# Patient Record
Sex: Female | Born: 1950 | Race: White | Hispanic: No | Marital: Married | Smoking: Never smoker
Health system: Southern US, Community
[De-identification: ages and names within clinical notes are randomized; demographics above are authoritative.]

## PROBLEM LIST (undated history)

## (undated) DIAGNOSIS — E119 Type 2 diabetes mellitus without complications: Secondary | ICD-10-CM

## (undated) DIAGNOSIS — E079 Disorder of thyroid, unspecified: Secondary | ICD-10-CM

## (undated) DIAGNOSIS — I10 Essential (primary) hypertension: Secondary | ICD-10-CM

## (undated) DIAGNOSIS — E785 Hyperlipidemia, unspecified: Secondary | ICD-10-CM

## (undated) DIAGNOSIS — R000 Tachycardia, unspecified: Secondary | ICD-10-CM

## (undated) DIAGNOSIS — J45909 Unspecified asthma, uncomplicated: Secondary | ICD-10-CM

## (undated) HISTORY — PX: ABDOMINAL HYSTERECTOMY: SHX81

---

## 2013-02-13 ENCOUNTER — Emergency Department (HOSPITAL_COMMUNITY)
Admission: EM | Admit: 2013-02-13 | Discharge: 2013-02-13 | Disposition: A | Discharge status: Home / Self Care | Payer: Medicare HMO | Source: Home / Self Care

## 2013-02-13 ENCOUNTER — Encounter (HOSPITAL_COMMUNITY): Payer: Self-pay | Admitting: Emergency Medicine

## 2013-02-13 DIAGNOSIS — J329 Chronic sinusitis, unspecified: Secondary | ICD-10-CM

## 2013-02-13 HISTORY — DX: Unspecified asthma, uncomplicated: J45.909

## 2013-02-13 HISTORY — DX: Tachycardia, unspecified: R00.0

## 2013-02-13 HISTORY — DX: Disorder of thyroid, unspecified: E07.9

## 2013-02-13 LAB — POCT RAPID STREP A: Streptococcus, Group A Screen (Direct): NEGATIVE

## 2013-02-13 MED ORDER — AMOXICILLIN-POT CLAVULANATE 875-125 MG PO TABS: 1.0000 | ORAL_TABLET | Freq: Two times a day (BID) | ORAL | Status: DC

## 2013-02-13 NOTE — ED Notes (Signed)
Throat Culture sent to lab with manual request.  

## 2013-02-13 NOTE — ED Provider Notes (Signed)
History     CSN: [id number]  Arrival date & time 02/13/13  1426   First MD Initiated Contact with Patient 02/13/13 1543      Chief Complaint  Patient presents with  . URI    (Consider location/radiation/quality/duration/timing/severity/associated sxs/prior treatment) Patient is a 62 y.o. female presenting with URI. The history is provided by the patient.  URI Presenting symptoms: congestion, cough, facial pain, fatigue and sore throat   Presenting symptoms: no fever and no rhinorrhea   Severity:  Moderate Onset quality:  Sudden (has had allergy sx with clear rhinorrhea for a few months) Duration:  1 day Progression:  Unchanged Chronicity:  New Worsened by:  Nothing tried Associated symptoms: sinus pain   Associated symptoms: no arthralgias, no headaches, no myalgias, no neck pain, no sneezing, no swollen glands and no wheezing     Past Medical History  Diagnosis Date  . Asthma   . Thyroid disease   . Tachycardia     History reviewed. No pertinent past surgical history.  No family history on file.  History  Substance Use Topics  . Smoking status: Never Smoker   . Smokeless tobacco: Not on file  . Alcohol Use: Yes    OB History   Grav Para Term Preterm Abortions TAB SAB Ect Mult Living                  Review of Systems  Constitutional: Positive for fatigue. Negative for fever.  HENT: Positive for congestion and sore throat. Negative for rhinorrhea, sneezing and neck pain.   Respiratory: Positive for cough. Negative for wheezing.   Musculoskeletal: Negative for myalgias and arthralgias.  Neurological: Negative for headaches.  All other systems reviewed and are negative.    Allergies  Ciprofloxacin; Latex; and Neosporin  Home Medications   Current Outpatient Rx  Name  Route  Sig  Dispense  Refill  . esomeprazole (NEXIUM) 40 MG capsule   Oral   Take 40 mg by mouth daily before breakfast.         . metFORMIN (GLUCOPHAGE) 500 MG tablet   Oral  Take 500 mg by mouth 2 (two) times daily with a meal.         . metoprolol succinate (TOPROL-XL) 25 MG 24 hr tablet   Oral   Take 25 mg by mouth daily.         Marland Kitchen thyroid (ARMOUR) 90 MG tablet   Oral   Take 90 mg by mouth daily.           BP 142/85  Pulse 96  Temp(Src) 98.7 F (37.1 C) (Oral)  Resp 20  SpO2 97%  Physical Exam  Nursing note and vitals reviewed. Constitutional: She is oriented to person, place, and time. She appears well-developed and well-nourished.  HENT:  Head: Normocephalic and atraumatic.  Right Ear: External ear normal.  Left Ear: External ear normal.  Nose: Nose normal.  Mouth/Throat: Oropharynx is clear and moist. No oropharyngeal exudate.  Eyes: Conjunctivae and EOM are normal. Pupils are equal, round, and reactive to light. Right eye exhibits no discharge. Left eye exhibits no discharge. No scleral icterus.  Neck: Normal range of motion. Neck supple.  Cardiovascular: Normal rate, regular rhythm, normal heart sounds and intact distal pulses.   Pulmonary/Chest: Effort normal and breath sounds normal. No respiratory distress. She has no wheezes. She has no rales. She exhibits no tenderness.  Abdominal: Soft. Bowel sounds are normal. She exhibits no distension and no mass. There is  no tenderness. There is no rebound and no guarding.  Musculoskeletal: Normal range of motion. She exhibits no edema.  Lymphadenopathy:    She has no cervical adenopathy.  Neurological: She is alert and oriented to person, place, and time. She has normal reflexes. She displays normal reflexes. She exhibits abnormal muscle tone. Coordination normal.  Skin: Skin is warm and dry.  Psychiatric: She has a normal mood and affect.    ED Course  Procedures (including critical care time)  Labs Reviewed - No data to display No results found.   1. Sinusitis       MDM  Discussed dx and orders.  See instructions.  SE and precautions discussed        Maryelizabeth Rowan,  MD 02/13/13 1557

## 2013-02-13 NOTE — ED Notes (Signed)
Pt c/o cold sxs onset last night... sxs include: HA, ST, BA, mild cough Denies: f/v/n/d... She is alert and oriented w/no signs of acute distress.

## 2013-02-15 LAB — CULTURE, GROUP A STREP

## 2013-09-29 ENCOUNTER — Other Ambulatory Visit: Payer: Self-pay | Admitting: Family Medicine

## 2013-09-29 DIAGNOSIS — R51 Headache: Secondary | ICD-10-CM

## 2013-09-29 DIAGNOSIS — J329 Chronic sinusitis, unspecified: Secondary | ICD-10-CM

## 2013-09-29 DIAGNOSIS — R519 Headache, unspecified: Secondary | ICD-10-CM

## 2013-10-05 ENCOUNTER — Other Ambulatory Visit: Payer: Medicare HMO

## 2013-10-07 ENCOUNTER — Ambulatory Visit
Admission: RE | Admit: 2013-10-07 | Discharge: 2013-10-07 | Disposition: A | Discharge status: Home / Self Care | Payer: 59 | Source: Ambulatory Visit | Attending: Family Medicine | Admitting: Family Medicine

## 2013-10-07 DIAGNOSIS — J329 Chronic sinusitis, unspecified: Secondary | ICD-10-CM

## 2013-10-07 DIAGNOSIS — R519 Headache, unspecified: Secondary | ICD-10-CM

## 2013-10-07 DIAGNOSIS — R51 Headache: Secondary | ICD-10-CM

## 2014-04-11 ENCOUNTER — Other Ambulatory Visit: Payer: Self-pay | Admitting: Otolaryngology

## 2014-04-11 DIAGNOSIS — R0981 Nasal congestion: Secondary | ICD-10-CM

## 2014-04-11 DIAGNOSIS — R059 Cough, unspecified: Secondary | ICD-10-CM

## 2014-04-11 DIAGNOSIS — R05 Cough: Secondary | ICD-10-CM

## 2014-04-11 DIAGNOSIS — J0100 Acute maxillary sinusitis, unspecified: Secondary | ICD-10-CM

## 2014-04-19 ENCOUNTER — Encounter (INDEPENDENT_AMBULATORY_CARE_PROVIDER_SITE_OTHER): Payer: Self-pay

## 2014-04-19 ENCOUNTER — Ambulatory Visit
Admission: RE | Admit: 2014-04-19 | Discharge: 2014-04-19 | Disposition: A | Discharge status: Home / Self Care | Payer: 59 | Source: Ambulatory Visit | Attending: Otolaryngology | Admitting: Otolaryngology

## 2014-04-19 DIAGNOSIS — R059 Cough, unspecified: Secondary | ICD-10-CM

## 2014-04-19 DIAGNOSIS — R0981 Nasal congestion: Secondary | ICD-10-CM

## 2014-04-19 DIAGNOSIS — R05 Cough: Secondary | ICD-10-CM

## 2014-04-19 DIAGNOSIS — J0100 Acute maxillary sinusitis, unspecified: Secondary | ICD-10-CM

## 2014-07-18 ENCOUNTER — Ambulatory Visit (HOSPITAL_BASED_OUTPATIENT_CLINIC_OR_DEPARTMENT_OTHER): Payer: 59 | Attending: Otolaryngology | Admitting: Radiology

## 2014-07-18 VITALS — Ht 64.0 in | Wt 164.0 lb

## 2014-07-18 DIAGNOSIS — Z6828 Body mass index (BMI) 28.0-28.9, adult: Secondary | ICD-10-CM | POA: Diagnosis not present

## 2014-07-18 DIAGNOSIS — G4730 Sleep apnea, unspecified: Secondary | ICD-10-CM | POA: Diagnosis not present

## 2014-07-18 DIAGNOSIS — G4710 Hypersomnia, unspecified: Secondary | ICD-10-CM | POA: Insufficient documentation

## 2014-07-18 DIAGNOSIS — G4733 Obstructive sleep apnea (adult) (pediatric): Secondary | ICD-10-CM

## 2014-07-23 DIAGNOSIS — G4733 Obstructive sleep apnea (adult) (pediatric): Secondary | ICD-10-CM

## 2014-07-23 NOTE — Sleep Study (Signed)
NAME: Kayla Becker DATE OF BIRTH:  June 09, 1951 MEDICAL RECORD NUMBER [id number]  LOCATION: [facility]  PHYSICIAN: YOUNG,CLINTON D  DATE OF STUDY: 07/18/2014  SLEEP STUDY TYPE: Nocturnal Polysomnogram               REFERRING PHYSICIAN: Dillard Cannon E, *  INDICATION FOR STUDY: Hypersomnia with sleep apnea  EPWORTH SLEEPINESS SCORE:   16/24 HEIGHT: 5\' 4"  (162.6 cm)  WEIGHT: 164 lb (74.39 kg)    Body mass index is 28.14 kg/(m^2).  NECK SIZE: 13.5 in.  MEDICATIONS: Charted for review   SLEEP ARCHITECTURE: Total sleep time 344 minutes with sleep efficiency 87.5%. Stage I was 3.2%, stage II 69.8%, stage III 1.9%, REM 25.1% of total sleep time. Sleep latency 37.5 minutes, REM latency 70 minutes, awake after sleep onset 12.5 minutes, arousal index 12.9, bedtime medication: Crestor, losartan, Dexilant, nortriptyline, Omnaris, loratadine  RESPIRATORY DATA: Apnea hypopnea index (AHI) 1.7 per hour. 10 total events scored including 2 central apneas and 8 hypopneas. Non-positional events. REM AHI 5.5 per hour. There were not enough events to permit CPAP titration.  OXYGEN DATA: Moderate snoring with oxygen desaturation to a nadir of 90% and mean saturation 93.5% on room air.  CARDIAC DATA: Normal sinus rhythm  MOVEMENT/PARASOMNIA: No significant movement disturbance, no bathroom trips  IMPRESSION/ RECOMMENDATION:   1) Unremarkable sleep pattern 2) Respiratory pattern within normal limits. AHI 1.7 per hour- the normal range for adults is an AHI from 0-5 events per hour). REM AHI 5.5 per hour. Non-positional events. Moderate snoring with oxygen desaturation to a nadir of 90% and mean saturation 93.5% on room air.   Waymon Budge Diplomate, American Board of Sleep Medicine  ELECTRONICALLY SIGNED ON:  07/23/2014, 9:31 AM [facility] PH: [phone]   FX: [phone] ACCREDITED BY THE AMERICAN ACADEMY OF SLEEP MEDICINE

## 2014-08-03 ENCOUNTER — Other Ambulatory Visit: Payer: Self-pay

## 2014-08-03 DIAGNOSIS — Z1231 Encounter for screening mammogram for malignant neoplasm of breast: Secondary | ICD-10-CM

## 2014-08-24 ENCOUNTER — Ambulatory Visit
Admission: RE | Admit: 2014-08-24 | Discharge: 2014-08-24 | Disposition: A | Discharge status: Home / Self Care | Payer: Private Health Insurance - Indemnity | Source: Ambulatory Visit

## 2014-08-24 DIAGNOSIS — Z1231 Encounter for screening mammogram for malignant neoplasm of breast: Secondary | ICD-10-CM

## 2015-07-25 ENCOUNTER — Other Ambulatory Visit: Payer: Self-pay

## 2015-07-25 DIAGNOSIS — Z1231 Encounter for screening mammogram for malignant neoplasm of breast: Secondary | ICD-10-CM

## 2015-08-30 ENCOUNTER — Ambulatory Visit
Admission: RE | Admit: 2015-08-30 | Discharge: 2015-08-30 | Disposition: A | Discharge status: Home / Self Care | Payer: BLUE CROSS/BLUE SHIELD | Source: Ambulatory Visit

## 2015-08-30 DIAGNOSIS — Z1231 Encounter for screening mammogram for malignant neoplasm of breast: Secondary | ICD-10-CM

## 2016-05-11 ENCOUNTER — Ambulatory Visit (HOSPITAL_COMMUNITY)
Admission: EM | Admit: 2016-05-11 | Discharge: 2016-05-11 | Disposition: A | Discharge status: Home / Self Care | Payer: Managed Care, Other (non HMO) | Attending: Internal Medicine | Admitting: Internal Medicine

## 2016-05-11 ENCOUNTER — Encounter (HOSPITAL_COMMUNITY): Payer: Self-pay | Admitting: Emergency Medicine

## 2016-05-11 DIAGNOSIS — H15102 Unspecified episcleritis, left eye: Secondary | ICD-10-CM

## 2016-05-11 MED ORDER — DICLOFENAC SODIUM 0.1 % OP SOLN: 1.0000 [drp] | Freq: Four times a day (QID) | OPHTHALMIC | 0 refills | Status: AC

## 2016-05-11 MED ORDER — CARBOXYMETHYLCELLULOSE SODIUM 0.5 % OP SOLN: 2.0000 [drp] | Freq: Four times a day (QID) | OPHTHALMIC | 0 refills | Status: AC | PRN

## 2016-05-11 NOTE — ED Triage Notes (Signed)
Left eye issue

## 2016-05-11 NOTE — ED Notes (Signed)
Complains of headache, eye irritation.  Some nausea reported.  Patient denies pain when asked to assess for pain scale.  Reports "irritating".

## 2016-05-11 NOTE — ED Provider Notes (Signed)
[facility]    CSN: [id number] Arrival date & time: 05/11/16  1212  First Provider Contact:  First MD Initiated Contact with Patient 05/11/16 1426        History   Chief Complaint Chief Complaint  Patient presents with  . Eye Problem    HPI Kayla Becker is a 65 y.o. female. She presents today with a 2 day history of increasing discomfort and redness in the medial left eye. No photophobia. Doesn't really have eye pain, just discomfort, feels a little headachy around the eye. At times feels nauseous. Wears contacts, but has not worn them in the last 2 days. Little bit of discharge noted in the eye this morning.   Medical history of lichen planus.   Does not have an eye doctor, follows at the contact lens [facility] at [facility] center.  No cough, no respiratory symptoms, not short of breath. No unusual joint pains or aching. No GI symptoms. No fever.  HPI  Past Medical History:  Diagnosis Date  . Asthma   . Tachycardia   . Thyroid disease   lichen planus  History reviewed. No pertinent surgical history.   Home Medications    Prior to Admission medications   Medication Sig Start Date End Date Taking? Authorizing Provider  amoxicillin-clavulanate (AUGMENTIN) 875-125 MG per tablet Take 1 tablet by mouth 2 (two) times daily. 02/13/13   Lewis Moccasin, MD  esomeprazole (NEXIUM) 40 MG capsule Take 40 mg by mouth daily before breakfast.    Historical Provider, MD  metFORMIN (GLUCOPHAGE) 500 MG tablet Take 500 mg by mouth 2 (two) times daily with a meal.    Historical Provider, MD  metoprolol succinate (TOPROL-XL) 25 MG 24 hr tablet Take 25 mg by mouth daily.    Historical Provider, MD  thyroid (ARMOUR) 90 MG tablet Take 90 mg by mouth daily.    Historical Provider, MD    Family History History reviewed. No pertinent family history.  Social History Social History  Substance Use Topics  . Smoking status: Never Smoker  . Smokeless tobacco: Not on file  . Alcohol use  Yes     Allergies   Ciprofloxacin; Latex; and Neosporin [neomycin-bacitracin zn-polymyx]   Review of Systems Review of Systems  All other systems reviewed and are negative.    Physical Exam Triage Vital Signs ED Triage Vitals [05/11/16 1312]  Enc Vitals Group     BP 118/74     Pulse Rate 89     Resp 16     Temp 98.5 F (36.9 C)     Temp Source Oral     SpO2 100 %     Weight      Height    Updated Vital Signs BP 118/74 (BP Location: Left Arm)   Pulse 89   Temp 98.5 F (36.9 C) (Oral)   Resp 16   SpO2 100%   Visual Acuity Right Eye Distance: 20/50 Left Eye Distance: 20/30 Bilateral Distance: 20/30  Physical Exam  Constitutional: She is oriented to person, place, and time. No distress.  Alert, nicely groomed  HENT:  Head: Atraumatic.  Eyes: EOM are normal. Pupils are equal, round, and reactive to light.  Conjugate gaze. Medial aspect of the left eye has injection, no corneal irregularity appreciated with penlight exam or with fluorescein.  Eye is a little watery, no purulent discharge appreciated.  Neck: Neck supple.  Cardiovascular: Normal rate.   Pulmonary/Chest: No respiratory distress.  Lungs clear, symmetric breath sounds  Abdominal: She exhibits no distension.  Musculoskeletal: Normal range of motion.  No leg swelling  Neurological: She is alert and oriented to person, place, and time.  Skin: Skin is warm and dry.  No cyanosis  Nursing note and vitals reviewed.    UC Treatments / Results   Procedures Procedures (including critical care time)      None  Final Clinical Impressions(s) / UC Diagnoses   Final diagnoses:  Episcleritis of left eye   followup with eye doctor this week to confirm diagnosis and recheck left eye.  New Prescriptions New Prescriptions   CARBOXYMETHYLCELLULOSE (REFRESH PLUS) 0.5 % SOLN    Place 2 drops into the left eye 4 (four) times daily as needed.   DICLOFENAC (VOLTAREN) 0.1 % OPHTHALMIC SOLUTION    Place 1 drop  into the left eye 4 (four) times daily.     Eustace MooreLaura W Kobi Mario, MD 05/12/16 [phone]

## 2016-08-02 ENCOUNTER — Other Ambulatory Visit: Payer: Self-pay | Admitting: Family Medicine

## 2016-08-02 DIAGNOSIS — Z1231 Encounter for screening mammogram for malignant neoplasm of breast: Secondary | ICD-10-CM

## 2016-09-06 ENCOUNTER — Ambulatory Visit
Admission: RE | Admit: 2016-09-06 | Discharge: 2016-09-06 | Disposition: A | Discharge status: Home / Self Care | Payer: Managed Care, Other (non HMO) | Source: Ambulatory Visit | Attending: Family Medicine | Admitting: Family Medicine

## 2016-09-06 DIAGNOSIS — Z1231 Encounter for screening mammogram for malignant neoplasm of breast: Secondary | ICD-10-CM

## 2016-10-31 DIAGNOSIS — E782 Mixed hyperlipidemia: Secondary | ICD-10-CM | POA: Insufficient documentation

## 2016-10-31 DIAGNOSIS — I10 Essential (primary) hypertension: Secondary | ICD-10-CM | POA: Insufficient documentation

## 2016-10-31 DIAGNOSIS — E559 Vitamin D deficiency, unspecified: Secondary | ICD-10-CM | POA: Insufficient documentation

## 2016-10-31 DIAGNOSIS — R739 Hyperglycemia, unspecified: Secondary | ICD-10-CM | POA: Insufficient documentation

## 2017-01-18 ENCOUNTER — Emergency Department (HOSPITAL_COMMUNITY)
Admission: EM | Admit: 2017-01-18 | Discharge: 2017-01-18 | Disposition: A | Discharge status: Home / Self Care | Payer: Managed Care, Other (non HMO) | Attending: Emergency Medicine | Admitting: Emergency Medicine

## 2017-01-18 ENCOUNTER — Encounter (HOSPITAL_COMMUNITY): Payer: Self-pay | Admitting: Emergency Medicine

## 2017-01-18 ENCOUNTER — Ambulatory Visit (HOSPITAL_COMMUNITY)
Admission: EM | Admit: 2017-01-18 | Discharge: 2017-01-18 | Disposition: A | Discharge status: Home / Self Care | Payer: Managed Care, Other (non HMO)

## 2017-01-18 ENCOUNTER — Emergency Department (HOSPITAL_COMMUNITY): Payer: Managed Care, Other (non HMO)

## 2017-01-18 DIAGNOSIS — R42 Dizziness and giddiness: Secondary | ICD-10-CM

## 2017-01-18 DIAGNOSIS — R0789 Other chest pain: Secondary | ICD-10-CM | POA: Diagnosis not present

## 2017-01-18 DIAGNOSIS — J45909 Unspecified asthma, uncomplicated: Secondary | ICD-10-CM | POA: Diagnosis not present

## 2017-01-18 DIAGNOSIS — E119 Type 2 diabetes mellitus without complications: Secondary | ICD-10-CM | POA: Diagnosis not present

## 2017-01-18 DIAGNOSIS — I10 Essential (primary) hypertension: Secondary | ICD-10-CM | POA: Diagnosis not present

## 2017-01-18 DIAGNOSIS — Z79899 Other long term (current) drug therapy: Secondary | ICD-10-CM | POA: Diagnosis not present

## 2017-01-18 DIAGNOSIS — R110 Nausea: Secondary | ICD-10-CM | POA: Diagnosis not present

## 2017-01-18 HISTORY — DX: Hyperlipidemia, unspecified: E78.5

## 2017-01-18 HISTORY — DX: Essential (primary) hypertension: I10

## 2017-01-18 HISTORY — DX: Type 2 diabetes mellitus without complications: E11.9

## 2017-01-18 LAB — BASIC METABOLIC PANEL
Anion gap: 9 (ref 5–15)
BUN: 9 mg/dL (ref 6–20)
CO2: 24 mmol/L (ref 22–32)
Calcium: 9.7 mg/dL (ref 8.9–10.3)
Chloride: 106 mmol/L (ref 101–111)
Creatinine, Ser: 0.71 mg/dL (ref 0.44–1.00)
GFR calc Af Amer: >60 mL/min (ref >60)
GFR calc non Af Amer: >60 mL/min (ref >60)
Glucose, Bld: 121 mg/dL — ABNORMAL HIGH (ref 65–99)
Potassium: 3.8 mmol/L (ref 3.5–5.1)
Sodium: 139 mmol/L (ref 135–145)

## 2017-01-18 LAB — CBC
HCT: 41.4 % (ref 36.0–46.0)
Hemoglobin: 13.7 g/dL (ref 12.0–15.0)
MCH: 31.9 pg (ref 26.0–34.0)
MCHC: 33.1 g/dL (ref 30.0–36.0)
MCV: 96.5 fL (ref 78.0–100.0)
Platelets: 253 10*3/uL (ref 150–400)
RBC: 4.29 MIL/uL (ref 3.87–5.11)
RDW: 12.9 % (ref 11.5–15.5)
WBC: 7.2 10*3/uL (ref 4.0–10.5)

## 2017-01-18 LAB — I-STAT TROPONIN, ED
Troponin i, poc: 0 ng/mL (ref 0.00–0.08)
Troponin i, poc: 0 ng/mL (ref 0.00–0.08)

## 2017-01-18 MED ORDER — SODIUM CHLORIDE 0.9 % IV BOLUS (SEPSIS)
1000.0000 mL | Freq: Once | INTRAVENOUS | Status: AC
  Administered 2017-01-18: 1000 mL via INTRAVENOUS

## 2017-01-18 MED ORDER — MECLIZINE HCL 25 MG PO TABS
25.0000 mg | ORAL_TABLET | Freq: Once | ORAL | Status: AC
  Administered 2017-01-18: 25 mg via ORAL
  Filled 2017-01-18: qty 1

## 2017-01-18 MED ORDER — ONDANSETRON HCL 4 MG/2ML IJ SOLN
4.0000 mg | Freq: Once | INTRAMUSCULAR | Status: AC
  Administered 2017-01-18: 4 mg via INTRAVENOUS
  Filled 2017-01-18: qty 2

## 2017-01-18 MED ORDER — ASPIRIN 81 MG PO CHEW
324.0000 mg | CHEWABLE_TABLET | Freq: Once | ORAL | Status: AC
  Administered 2017-01-18: 324 mg via ORAL
  Filled 2017-01-18: qty 4

## 2017-01-18 MED ORDER — NITROGLYCERIN 0.4 MG SL SUBL: 0.4000 mg | SUBLINGUAL_TABLET | SUBLINGUAL | Status: DC | PRN

## 2017-01-18 MED ORDER — ERYTHROMYCIN 5 MG/GM OP OINT
1.0000 "application " | TOPICAL_OINTMENT | Freq: Once | OPHTHALMIC | Status: AC
  Administered 2017-01-18: 1 via OPHTHALMIC
  Filled 2017-01-18: qty 3.5

## 2017-01-18 MED ORDER — MECLIZINE HCL 25 MG PO TABS: 25.0000 mg | ORAL_TABLET | Freq: Three times a day (TID) | ORAL | 0 refills | Status: AC | PRN

## 2017-01-18 MED ORDER — ONDANSETRON HCL 4 MG PO TABS: 4.0000 mg | ORAL_TABLET | Freq: Four times a day (QID) | ORAL | 0 refills | Status: DC

## 2017-01-18 NOTE — ED Triage Notes (Signed)
Pt c/o dizziness started at 10am-- went to the gym, at approx 11am-- started getting nauseated, had difficulty breathing-- still feels like she can't catch breath, now has pounding headache. Chest pain midsternal "Like someone sitting on me-- I can't get a good breath"

## 2017-01-18 NOTE — Discharge Instructions (Signed)
Return to ED if symptoms worsen

## 2017-01-18 NOTE — ED Notes (Signed)
Pt advised to go to ED per Dr. Dayton ScrapeMurray.

## 2017-01-18 NOTE — ED Provider Notes (Signed)
[facility] Note   CSN: [id number] Arrival date & time: 01/18/17  1235     History   Chief Complaint Chief Complaint  Patient presents with  . Chest Pain  . Headache  . Dizziness    HPI Kayla Becker is a 66 y.o. female.  The history is provided by the patient.  Chest Pain   This is a new problem. The current episode started 3 to 5 hours ago. The problem occurs constantly. The problem has not changed since onset.The pain is associated with rest. The pain is present in the substernal region. The pain is at a severity of 5/10. The pain is moderate. The quality of the pain is described as pressure-like. The pain does not radiate. Associated symptoms include dizziness, nausea and shortness of breath. Pertinent negatives include no abdominal pain, no back pain, no claudication, no cough, no diaphoresis, no exertional chest pressure, no fever, no headaches, no hemoptysis, no irregular heartbeat, no leg pain, no lower extremity edema, no malaise/fatigue, no near-syncope, no numbness, no orthopnea, no palpitations, no PND, no sputum production, no syncope, no vomiting and no weakness. She has tried nothing for the symptoms.  Her past medical history is significant for diabetes, hyperlipidemia and hypertension.  Pertinent negatives for past medical history include no seizures.  Her family medical history is significant for CAD.    Past Medical History:  Diagnosis Date  . Asthma   . Diabetes mellitus without complication (HCC)   . Hyperlipidemia   . Hypertension   . Tachycardia   . Thyroid disease     There are no active problems to display for this patient.   Past Surgical History:  Procedure Laterality Date  . ABDOMINAL HYSTERECTOMY      OB History    No data available       Home Medications    Prior to Admission medications   Medication Sig Start Date End Date Taking? Authorizing Provider  esomeprazole (NEXIUM) 40 MG capsule Take 40 mg by mouth daily  before breakfast.    [provider]  losartan (COZAAR) 100 MG tablet Take 100 mg by mouth daily.    [provider]  meclizine (ANTIVERT) 25 MG tablet Take 1 tablet (25 mg total) by mouth 3 (three) times daily as needed for dizziness. 01/18/17 01/28/17  Steffon Gladu, DO  metFORMIN (GLUCOPHAGE) 500 MG tablet Take 500 mg by mouth 2 (two) times daily with a meal.    [provider]  metoprolol succinate (TOPROL-XL) 25 MG 24 hr tablet Take 25 mg by mouth daily.    [provider]  ondansetron (ZOFRAN) 4 MG tablet Take 1 tablet (4 mg total) by mouth every 6 (six) hours. 01/18/17   Oliveah Zwack, DO  rosuvastatin (CRESTOR) 10 MG tablet Take 10 mg by mouth daily.    [provider]  thyroid (ARMOUR) 90 MG tablet Take 90 mg by mouth daily.    [provider]    Family History No family history on file.  Social History Social History  Substance Use Topics  . Smoking status: Never Smoker  . Smokeless tobacco: Never Used  . Alcohol use Yes     Comment: socially     Allergies   Ciprofloxacin; Latex; and Neosporin [neomycin-bacitracin zn-polymyx]   Review of Systems Review of Systems  Constitutional: Negative for chills, diaphoresis, fever and malaise/fatigue.  HENT: Negative for ear pain and sore throat.   Eyes: Negative for pain and visual disturbance.  Respiratory: Positive  for shortness of breath. Negative for cough, hemoptysis and sputum production.   Cardiovascular: Positive for chest pain. Negative for palpitations, orthopnea, claudication, syncope, PND and near-syncope.  Gastrointestinal: Positive for nausea. Negative for abdominal pain and vomiting.  Genitourinary: Negative for dysuria and hematuria.  Musculoskeletal: Negative for arthralgias and back pain.  Skin: Negative for color change and rash.  Neurological: Positive for dizziness and light-headedness. Negative for tremors, seizures, syncope, facial asymmetry, speech  difficulty, weakness, numbness and headaches.  All other systems reviewed and are negative.    Physical Exam Updated Vital Signs ED Triage Vitals  Enc Vitals Group     BP 01/18/17 1255 137/87     Pulse Rate 01/18/17 1255 93     Resp 01/18/17 1255 18     Temp 01/18/17 1255 97.9 F (36.6 C)     Temp Source 01/18/17 1255 Oral     SpO2 01/18/17 1255 98 %     Weight 01/18/17 1258 155 lb (70.3 kg)     Height 01/18/17 1258 5' 3.5" (1.613 m)     Head Circumference --      Peak Flow --      Pain Score 01/18/17 1254 7     Pain Loc --      Pain Edu? --      Excl. in GC? --     Physical Exam  Constitutional: She is oriented to person, place, and time. She appears well-developed and well-nourished. No distress.  HENT:  Head: Normocephalic and atraumatic.  Eyes: Conjunctivae and EOM are normal. Pupils are equal, round, and reactive to light.  Neck: Normal range of motion. Neck supple.  Cardiovascular: Normal rate, regular rhythm, normal heart sounds and intact distal pulses.   No murmur heard. Pulmonary/Chest: Effort normal and breath sounds normal. No respiratory distress.  Abdominal: Soft. There is no tenderness.  Musculoskeletal: She exhibits no edema.  Neurological: She is alert and oriented to person, place, and time. No cranial nerve deficit or sensory deficit. She exhibits normal muscle tone. Coordination normal.  5+/5 strength, normal sensation, no drift, normal finger to nose finger, + dix-hallpike  Skin: Skin is warm and dry.  Psychiatric: She has a normal mood and affect.  Nursing note and vitals reviewed.    ED Treatments / Results  Labs (all labs ordered are listed, but only abnormal results are displayed) Labs Reviewed  BASIC METABOLIC PANEL - Abnormal; Notable for the following:       Result Value   Glucose, Bld 121 (*)    All other components within normal limits  CBC  I-STAT TROPOININ, ED  I-STAT TROPOININ, ED    EKG  EKG  Interpretation  Date/Time:  Saturday Jan 18 2017 12:45:31 EDT Ventricular Rate:  96 PR Interval:  138 QRS Duration: 78 QT Interval:  364 QTC Calculation: 459 R Axis:   20 Text Interpretation:  Normal sinus rhythm Possible Anterior infarct , age undetermined Abnormal ECG No old tracing to compare Confirmed by Ethelda Chick  MD, SAM [phone]) on 01/18/2017 2:21:22 PM       Radiology Dg Chest 2 View  Result Date: 01/18/2017 CLINICAL DATA:  Pt c/o Center sub sternal chest pain, dizziness, nausea without vomiting, frontal/parietal headache, SOB all beginning today at 10:00 am. Pt denies cough or fever. Hx HTN, prediabetic, asthma, nonsmoker EXAM: CHEST  2 VIEW COMPARISON:  None. FINDINGS: Cardiac silhouette is normal in size and configuration. Normal mediastinal and hilar contours. The lungs are clear. No pleural effusion or pneumothorax.  Skeletal structures are intact. IMPRESSION: No active cardiopulmonary disease. Electronically Signed   By: Amie Portland M.D.   On: 01/18/2017 13:38    Procedures Procedures (including critical care time)  Medications Ordered in ED Medications  meclizine (ANTIVERT) tablet 25 mg (25 mg Oral Given 01/18/17 1532)  sodium chloride 0.9 % bolus 1,000 mL (0 mLs Intravenous Stopped 01/18/17 1755)  aspirin chewable tablet 324 mg (324 mg Oral Given 01/18/17 1532)  ondansetron (ZOFRAN) injection 4 mg (4 mg Intravenous Given 01/18/17 1532)  erythromycin ophthalmic ointment 1 application (1 application Both Eyes Given 01/18/17 1758)     Initial Impression / Assessment and Plan / ED Course  I have reviewed the triage vital signs and the nursing notes.  Pertinent labs & imaging results that were available during my care of the patient were reviewed by me and considered in my medical decision making (see chart for details).     Kaydyn Chism is 66 year old female with history of high cholesterol, diabetes, hypertension who presents to the ED with chest pain and dizziness.  Patient's vitals at time of arrival to the ED are unremarkable and patient is without fever. Patient with onset of dizziness around 10 AM associated with nausea and the sensation of the room spinning. Patient also with chest pressure that started about an hour and a half later and is ongoing. Patient denies weakness in legs, arms and states sinus type headache. Patient has history of migraines. Patient denies radiation of pain is but feels overall weak. Patient states that her father possibly with heart disease before the age of 49. Patient denies any DVT or PE risk factors. Patient with normal neurological exam except for positive Dix-Hallpike consistent with peripheral vertigo. Patient yesterday with no signs of vertical nystagmus or central process. Otherwise exam unremarkable. Low concern for PE or DVT as patient is Wells criteria 0. Patient with multiple cardiac risk factors and concern for ACS. Patient with no signs of fever, cough, sputum production and doubt pneumonia. To evaluate will get EKG, CBC, BMP, troponin, chest x-ray. Concern for peripheral vertigo and we'll give patient normal saline bolus, meclizine, Zofran. Concern for ACS and will give ASA 324 mg and re-eval as symptoms could be related to vertigo/mirgaine feeling as this is primary complaint of patient. Patient also state some left eye drainage after accidentally putting in two contacts to left eye a few days ago and will give romycin.   Patient with EKG that shows normal sinus rhythm at 96 bpm with no signs of acute ischemia and normal intervals. Patient with troponin within normal limits. Will trend. Chest x-ray with no signs of pneumonia, pneumothorax, pleural effusion. Patient with unremarkable CBC and BMP with no significant anemia or electrolyte abnormalities.   Patient with heart score of 4, at risk for ACS. Discussed staying for stress test but patient would like to follow up with primary care doctor and perform that as an  outpatient. Patient understands the risks of missing a heart attack but shared decision was used. Patient had follow-up troponin undetectable as well and repeat EKG with no signs of ischemic changes. Patient with improved symptoms following meclizine and Zofran and was discharged to home with prescription for both. Patient likely with peripheral vertigo. Ongoing concern for ACS, patient has capacity and decline observation stay for stress test. Recommend return to ED if pain worsens or symptoms worsen. Patient discharged to home also with the medicine for possible left eye infection. Patient given strict return precautions and discharged  from ED in good condition. Recommend close f/u with PCP.   Final Clinical Impressions(s) / ED Diagnoses   Final diagnoses:  Atypical chest pain  Vertigo    New Prescriptions Discharge Medication List as of 01/18/2017  5:46 PM    START taking these medications   Details  meclizine (ANTIVERT) 25 MG tablet Take 1 tablet (25 mg total) by mouth 3 (three) times daily as needed for dizziness., Starting Sat 01/18/2017, Until Tue 01/28/2017, Print    ondansetron (ZOFRAN) 4 MG tablet Take 1 tablet (4 mg total) by mouth every 6 (six) hours., Starting Sat 01/18/2017, Print         Virgina Norfolk, DO 01/18/17 1836    Mesner, Barbara Cower, MD 01/18/17 2300

## 2017-01-18 NOTE — ED Provider Notes (Signed)
I saw and evaluated the patient, reviewed the resident's note and I agree with the findings and plan with the following exceptions.   66 yo F here with a couple weeks of vertigo progressively worsening. Today had a severe episode that was associated with nausea, sob and chest pressure.  Here has worsening symptoms with head movement, body movement and still has vague chest discomfort even though sob and nausea have improved. Neuro exam unremarkable. ecg ok aside from q waves.  Plan for symptom control for likely vertigo.  Will need ACS rule out as well. Has RF's, so heart score is 4, making moderate risk, will likely need hospital obs.   EKG Interpretation  Date/Time:  Saturday Jan 18 2017 12:45:31 EDT Ventricular Rate:  96 PR Interval:  138 QRS Duration: 78 QT Interval:  364 QTC Calculation: 459 R Axis:   20 Text Interpretation:  Normal sinus rhythm Possible Anterior infarct , age undetermined Abnormal ECG No old tracing to compare Confirmed by Ethelda ChickJACUBOWITZ  MD, SAM [phone]) on 01/18/2017 2:21:22 PM         Kayla Becker, Barbara CowerJason, MD 01/18/17 2300

## 2017-04-02 ENCOUNTER — Encounter: Payer: Self-pay | Admitting: Internal Medicine

## 2017-05-08 ENCOUNTER — Telehealth: Payer: Self-pay | Admitting: *Deleted

## 2017-05-08 NOTE — Telephone Encounter (Signed)
Dr. Rhea BeltonPyrtle and [name],  This pt is [facility]  Thanks,  Cathlyn ParsonsJohn Takeysha Bonk

## 2017-05-08 NOTE — Telephone Encounter (Signed)
Will proceed as scheduled Elizebeth BrookingMarie PV  Thanks !!

## 2017-05-08 NOTE — Telephone Encounter (Signed)
Dr Rhea BeltonPyrtle, Kayla Becker is scheduled for a colon with you on 10-2 Tuesday as a direct . Chart [place], no other GI hx. She went to the ED 01-18-2017 for SOB, chest pressure, EKG showed NSR rate 96 and they recommended stress test which she declined and wanted to do outpt.  Her PCP saw her 8-1 and no mention of this test or any other cardiac issues int he notes. Do you want her to proceed with her colon as scheduled or does she need an OV  Thanks for your time, [name]

## 2017-05-08 NOTE — Telephone Encounter (Signed)
I reviewed the ED visit from May 2018 Apparently her symptoms were felt to be precipitated by vertigo, which she has a hx of.  All improved with meclizine and zofran. I am okay with direct [facility] colon, however, I will anesthesia, Cathlyn ParsonsJohn Nulty, to ensure he too is comfortable proceeding Thanks Liz ClaiborneMarie JMP

## 2017-05-08 NOTE — Telephone Encounter (Signed)
Will forward to AR[employer]J Nulty CRNa to review chart as well Kayla RuizJohn, can you review this chart and make sure she is okay for [facility]- see initial note to Dr Rhea BeltonPyrtle.   Thanks, [name]

## 2017-06-03 ENCOUNTER — Encounter: Payer: Managed Care, Other (non HMO) | Admitting: Internal Medicine

## 2017-06-05 ENCOUNTER — Encounter: Payer: Self-pay | Admitting: Family Medicine

## 2017-08-14 ENCOUNTER — Other Ambulatory Visit: Payer: Self-pay | Admitting: Family Medicine

## 2017-08-14 DIAGNOSIS — Z1231 Encounter for screening mammogram for malignant neoplasm of breast: Secondary | ICD-10-CM

## 2017-09-16 ENCOUNTER — Other Ambulatory Visit: Payer: Self-pay | Admitting: Family Medicine

## 2017-09-16 ENCOUNTER — Ambulatory Visit
Admission: RE | Admit: 2017-09-16 | Discharge: 2017-09-16 | Disposition: A | Discharge status: Home / Self Care | Payer: 59 | Source: Ambulatory Visit | Attending: Family Medicine | Admitting: Family Medicine

## 2017-09-16 DIAGNOSIS — R509 Fever, unspecified: Secondary | ICD-10-CM

## 2017-09-16 DIAGNOSIS — R05 Cough: Secondary | ICD-10-CM

## 2017-09-16 DIAGNOSIS — R0789 Other chest pain: Secondary | ICD-10-CM

## 2017-09-16 DIAGNOSIS — R059 Cough, unspecified: Secondary | ICD-10-CM

## 2017-09-17 ENCOUNTER — Ambulatory Visit: Payer: Self-pay

## 2017-10-02 ENCOUNTER — Ambulatory Visit
Admission: RE | Admit: 2017-10-02 | Discharge: 2017-10-02 | Disposition: A | Discharge status: Home / Self Care | Payer: 59 | Source: Ambulatory Visit | Attending: Family Medicine | Admitting: Family Medicine

## 2017-10-02 DIAGNOSIS — Z1231 Encounter for screening mammogram for malignant neoplasm of breast: Secondary | ICD-10-CM

## 2017-11-28 ENCOUNTER — Ambulatory Visit
Admission: RE | Admit: 2017-11-28 | Discharge: 2017-11-28 | Disposition: A | Discharge status: Home / Self Care | Payer: 59 | Source: Ambulatory Visit | Attending: Family Medicine | Admitting: Family Medicine

## 2017-11-28 ENCOUNTER — Other Ambulatory Visit: Payer: Self-pay | Admitting: Family Medicine

## 2017-11-28 DIAGNOSIS — R059 Cough, unspecified: Secondary | ICD-10-CM

## 2017-11-28 DIAGNOSIS — R05 Cough: Secondary | ICD-10-CM

## 2018-05-25 DIAGNOSIS — R7303 Prediabetes: Secondary | ICD-10-CM | POA: Insufficient documentation

## 2018-09-04 ENCOUNTER — Other Ambulatory Visit: Payer: Self-pay | Admitting: Family Medicine

## 2018-09-04 DIAGNOSIS — Z1231 Encounter for screening mammogram for malignant neoplasm of breast: Secondary | ICD-10-CM

## 2018-10-05 ENCOUNTER — Ambulatory Visit
Admission: RE | Admit: 2018-10-05 | Discharge: 2018-10-05 | Disposition: A | Discharge status: Home / Self Care | Payer: 59 | Source: Ambulatory Visit | Attending: Family Medicine | Admitting: Family Medicine

## 2018-10-05 DIAGNOSIS — Z1231 Encounter for screening mammogram for malignant neoplasm of breast: Secondary | ICD-10-CM

## 2019-09-29 ENCOUNTER — Ambulatory Visit: Payer: Managed Care, Other (non HMO)

## 2019-10-07 ENCOUNTER — Ambulatory Visit: Payer: Medicare Other | Attending: Internal Medicine

## 2019-10-07 DIAGNOSIS — Z23 Encounter for immunization: Secondary | ICD-10-CM | POA: Insufficient documentation

## 2019-10-07 NOTE — Progress Notes (Signed)
Covid-19 Vaccination Clinic  Name:  Kiera Erman    MRN: [id number] DOB: 1950-09-25  10/07/2019  Ms. Tobey was observed post Covid-19 immunization for 15 minutes without incidence. She was provided with Vaccine Information Sheet and instruction to access the V-Safe system.   Ms. Stahmer was instructed to call 911 with any severe reactions post vaccine: Marland Kitchen Difficulty breathing  . Swelling of your face and throat  . A fast heartbeat  . A bad rash all over your body  . Dizziness and weakness    Immunizations Administered    Name Date Dose VIS Date Route   Pfizer COVID-19 Vaccine 10/07/2019  5:27 PM 0.3 mL 08/13/2019 Intramuscular   Manufacturer: [employer], [employer]   Lot: [id number]   NDC: [address]-4

## 2019-11-02 ENCOUNTER — Ambulatory Visit: Payer: Medicare Other

## 2019-11-02 ENCOUNTER — Ambulatory Visit: Payer: Medicare Other | Attending: Internal Medicine

## 2019-11-02 DIAGNOSIS — Z23 Encounter for immunization: Secondary | ICD-10-CM | POA: Insufficient documentation

## 2019-11-02 NOTE — Progress Notes (Signed)
Covid-19 Vaccination Clinic  Name:  Kayla Becker    MRN: [id number] DOB: 22-Jul-1951  11/02/2019  Ms. Gut was observed post Covid-19 immunization for 15 minutes without incident. She was provided with Vaccine Information Sheet and instruction to access the V-Safe system.   [name] was instructed to call 911 with any severe reactions post vaccine: Marland Kitchen Difficulty breathing  . Swelling of face and throat  . A fast heartbeat  . A bad rash all over body  . Dizziness and weakness   Immunizations Administered    Name Date Dose VIS Date Route   Pfizer COVID-19 Vaccine 11/02/2019  4:18 PM 0.3 mL 08/13/2019 Intramuscular   Manufacturer: [employer], [employer]   Lot: [id number]   NDC: [address]-2

## 2020-06-27 DIAGNOSIS — E538 Deficiency of other specified B group vitamins: Secondary | ICD-10-CM | POA: Insufficient documentation

## 2020-09-29 DIAGNOSIS — E0500 Thyrotoxicosis with diffuse goiter without thyrotoxic crisis or storm: Secondary | ICD-10-CM | POA: Insufficient documentation

## 2020-09-29 DIAGNOSIS — J45909 Unspecified asthma, uncomplicated: Secondary | ICD-10-CM | POA: Insufficient documentation

## 2020-09-29 DIAGNOSIS — E7800 Pure hypercholesterolemia, unspecified: Secondary | ICD-10-CM | POA: Insufficient documentation

## 2020-09-29 DIAGNOSIS — E039 Hypothyroidism, unspecified: Secondary | ICD-10-CM | POA: Insufficient documentation

## 2020-10-18 ENCOUNTER — Other Ambulatory Visit: Payer: Self-pay | Admitting: Endocrinology

## 2020-10-18 DIAGNOSIS — Z1231 Encounter for screening mammogram for malignant neoplasm of breast: Secondary | ICD-10-CM

## 2020-12-06 ENCOUNTER — Other Ambulatory Visit: Payer: Self-pay

## 2020-12-06 ENCOUNTER — Ambulatory Visit
Admission: RE | Admit: 2020-12-06 | Discharge: 2020-12-06 | Disposition: A | Discharge status: Home / Self Care | Payer: Medicare Other | Source: Ambulatory Visit | Attending: Endocrinology | Admitting: Endocrinology

## 2020-12-06 DIAGNOSIS — Z1231 Encounter for screening mammogram for malignant neoplasm of breast: Secondary | ICD-10-CM

## 2021-07-06 ENCOUNTER — Other Ambulatory Visit: Payer: Self-pay | Admitting: Family Medicine

## 2021-07-06 DIAGNOSIS — R42 Dizziness and giddiness: Secondary | ICD-10-CM

## 2021-07-06 DIAGNOSIS — R519 Headache, unspecified: Secondary | ICD-10-CM

## 2021-07-07 ENCOUNTER — Ambulatory Visit
Admission: RE | Admit: 2021-07-07 | Discharge: 2021-07-07 | Disposition: A | Discharge status: Home / Self Care | Payer: Medicare Other | Source: Ambulatory Visit | Attending: Family Medicine | Admitting: Family Medicine

## 2021-07-07 DIAGNOSIS — R42 Dizziness and giddiness: Secondary | ICD-10-CM

## 2021-07-07 DIAGNOSIS — R519 Headache, unspecified: Secondary | ICD-10-CM

## 2021-07-07 MED ORDER — GADOBENATE DIMEGLUMINE 529 MG/ML IV SOLN
14.0000 mL | Freq: Once | INTRAVENOUS | Status: AC | PRN
  Administered 2021-07-07: 14 mL via INTRAVENOUS

## 2021-11-28 ENCOUNTER — Telehealth: Payer: Self-pay | Admitting: Orthopedic Surgery

## 2021-11-28 NOTE — Telephone Encounter (Signed)
This is in reference to her husbands chart. Will close out this call for her. I will call her back with documentation on his account. ?

## 2021-11-28 NOTE — Telephone Encounter (Signed)
Pt is requesting to speak with you.

## 2021-12-25 ENCOUNTER — Other Ambulatory Visit (HOSPITAL_COMMUNITY): Payer: Self-pay

## 2021-12-31 ENCOUNTER — Ambulatory Visit (INDEPENDENT_AMBULATORY_CARE_PROVIDER_SITE_OTHER): Payer: Medicare Other

## 2021-12-31 ENCOUNTER — Ambulatory Visit (INDEPENDENT_AMBULATORY_CARE_PROVIDER_SITE_OTHER): Payer: Medicare Other | Admitting: Orthopedic Surgery

## 2021-12-31 DIAGNOSIS — M4306 Spondylolysis, lumbar region: Secondary | ICD-10-CM

## 2021-12-31 DIAGNOSIS — M545 Low back pain, unspecified: Secondary | ICD-10-CM

## 2021-12-31 DIAGNOSIS — G8929 Other chronic pain: Secondary | ICD-10-CM | POA: Diagnosis not present

## 2021-12-31 MED ORDER — PREDNISONE 10 MG PO TABS
10.0000 mg | ORAL_TABLET | Freq: Every day | ORAL | 0 refills | Status: AC
Start: 2021-12-31 — End: 2022-01-30

## 2022-01-28 ENCOUNTER — Encounter: Payer: Self-pay | Admitting: Orthopedic Surgery

## 2022-01-28 NOTE — Progress Notes (Signed)
Office Visit Note   Patient: Kayla Becker           Date of Birth: 04/12/1951           MRN: [id number] Visit Date: 12/31/2021              Requested by: Lewis Moccasin, MD [address] [place],  [place] [address] PCP: Lewis Moccasin, MD  Chief Complaint  Patient presents with   Lower Back - Pain      HPI: Patient is a 71 year old woman who complains of pain across her lower lumbar spine buttocks to buttocks.  She denies any radicular symptoms.  She states it hurts to sit.  She states she has been having back symptoms since age 36.  She states that Advil has helped a little.  Assessment & Plan: Visit Diagnoses:  1. 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  She is called in a prescription for prednisone 10 mg with breakfast.  She will hold on the nonsteroidals while using prednisone.  Follow-Up Instructions: Return in about 4 weeks (around 01/28/2022).   Ortho Exam  Patient is alert, oriented, no adenopathy, well-dressed, normal affect, normal respiratory effort. Examination patient has mild arthritis in her hip but no pain reproduced with range of motion of her hips.  She has a negative straight leg raise and no focal motor weakness.  She has no pain with range of motion of the hip knee or ankle.  Imaging: No results found. No images are attached to the encounter.  Labs: Lab Results  Component Value Date   REPTSTATUS 02/15/2013 FINAL 02/13/2013   CULT No Beta Hemolytic Streptococci Isolated 02/13/2013     No results found for: ALBUMIN, PREALBUMIN, CBC  No results found for: MG No results found for: VD25OH  No results found for: PREALBUMIN    Latest Ref Rng & Units 01/18/2017   12:43 PM  CBC EXTENDED  WBC 4.0 - 10.5 K/uL 7.2    RBC 3.87 - 5.11 MIL/uL 4.29    Hemoglobin 12.0 - 15.0 g/dL 19.1    HCT 47.8 - 29.5 % 41.4    Platelets 150 - 400 K/uL 253       There is no height or weight on  file to calculate BMI.  Orders:  Orders Placed This Encounter  Procedures   XR Lumbar Spine 2-3 Views   Meds ordered this encounter  Medications   predniSONE (DELTASONE) 10 MG tablet    Sig: Take 1 tablet (10 mg total) by mouth daily with breakfast.    Dispense:  30 tablet    Refill:  0     Procedures: No procedures performed  Clinical Data: No additional findings.  ROS:  All other systems negative, except as noted in the HPI. Review of Systems  Objective: Vital Signs: There were no vitals taken for this visit.  Specialty Comments:  No specialty comments available.  PMFS History: There are no problems to display for this patient.  Past Medical History:  Diagnosis Date   Asthma    Diabetes mellitus without complication (HCC)    Hyperlipidemia    Hypertension    Tachycardia    Thyroid disease     Family History  Problem Relation Age of Onset   Breast cancer Neg Hx     Past Surgical History:  Procedure Laterality Date   ABDOMINAL HYSTERECTOMY     Social History  Occupational History   Not on file  Tobacco Use   Smoking status: Never   Smokeless tobacco: Never  Vaping Use   Vaping Use: Never used  Substance and Sexual Activity   Alcohol use: Yes    Comment: socially   Drug use: No   Sexual activity: Not on file

## 2022-02-14 ENCOUNTER — Other Ambulatory Visit: Payer: Self-pay | Admitting: Internal Medicine

## 2022-02-14 DIAGNOSIS — Z13820 Encounter for screening for osteoporosis: Secondary | ICD-10-CM

## 2022-03-19 ENCOUNTER — Other Ambulatory Visit: Payer: Self-pay | Admitting: Internal Medicine

## 2022-03-19 DIAGNOSIS — Z1231 Encounter for screening mammogram for malignant neoplasm of breast: Secondary | ICD-10-CM

## 2022-04-04 ENCOUNTER — Encounter: Payer: Self-pay | Admitting: Orthopedic Surgery

## 2022-04-04 ENCOUNTER — Ambulatory Visit (INDEPENDENT_AMBULATORY_CARE_PROVIDER_SITE_OTHER): Payer: Medicare Other | Admitting: Orthopedic Surgery

## 2022-04-04 DIAGNOSIS — M545 Low back pain, unspecified: Secondary | ICD-10-CM | POA: Diagnosis not present

## 2022-04-04 DIAGNOSIS — G8929 Other chronic pain: Secondary | ICD-10-CM

## 2022-04-04 DIAGNOSIS — M4306 Spondylolysis, lumbar region: Secondary | ICD-10-CM | POA: Diagnosis not present

## 2022-04-04 NOTE — Progress Notes (Signed)
Office Visit Note   Patient: Kayla Becker           Date of Birth: June 18, 1951           MRN: [id number] Visit Date: 04/04/2022              Requested by: Lewis Moccasin, MD [address] [place],  [place] [address] PCP: Lewis Moccasin, MD  Chief Complaint  Patient presents with   Lower Back - Pain      HPI: Patient is a 71 steroid injections in the past with good relief.  She just completed a course of prednisone with no improvement.  Patient states she has pain in both buttocks and her lower lumbar spine.71-year-old woman with chronic lower back pain since age 52 with a pars defect L5-S1.  Patient has had  Assessment & Plan: Visit Diagnoses:  1. Pars defect of lumbar spine   2. Chronic bilateral low back pain without sciatica     Plan: Will request consultation with Dr. Alvester Morin for evaluation for steroid injection for the bilateral pars defect pain.  Follow-Up Instructions: Return if symptoms worsen or fail to improve.   Ortho Exam  Patient is alert, oriented, no adenopathy, well-dressed, normal affect, normal respiratory effort. Examination patient has no focal motor weakness in either lower extremity no sciatic tension signs.  Imaging: No results found. No images are attached to the encounter.  Labs: Lab Results  Component Value Date   REPTSTATUS 02/15/2013 FINAL 02/13/2013   CULT No Beta Hemolytic Streptococci Isolated 02/13/2013     No results found for: "ALBUMIN", "PREALBUMIN", "CBC"  No results found for: "MG" No results found for: "VD25OH"  No results found for: "PREALBUMIN"    Latest Ref Rng & Units 01/18/2017   12:43 PM  CBC EXTENDED  WBC 4.0 - 10.5 K/uL 7.2   RBC 3.87 - 5.11 MIL/uL 4.29   Hemoglobin 12.0 - 15.0 g/dL 43.3   HCT 29.5 - 18.8 % 41.4   Platelets 150 - 400 K/uL 253      There is no height or weight on file to calculate BMI.  Orders:  No orders of the defined types were placed in this encounter.  No orders of the defined  types were placed in this encounter.    Procedures: No procedures performed  Clinical Data: No additional findings.  ROS:  All other systems negative, except as noted in the HPI. Review of Systems  Objective: Vital Signs: There were no vitals taken for this visit.  Specialty Comments:  No specialty comments available.  PMFS History: There are no problems to display for this patient.  Past Medical History:  Diagnosis Date   Asthma    Diabetes mellitus without complication (HCC)    Hyperlipidemia    Hypertension    Tachycardia    Thyroid disease     Family History  Problem Relation Age of Onset   Breast cancer Neg Hx     Past Surgical History:  Procedure Laterality Date   ABDOMINAL HYSTERECTOMY     Social History   Occupational History   Not on file  Tobacco Use   Smoking status: Never   Smokeless tobacco: Never  Vaping Use   Vaping Use: Never used  Substance and Sexual Activity   Alcohol use: Yes    Comment: socially   Drug use: No   Sexual activity: Not on file

## 2022-04-29 ENCOUNTER — Ambulatory Visit: Payer: Medicare Other

## 2022-04-29 ENCOUNTER — Ambulatory Visit (INDEPENDENT_AMBULATORY_CARE_PROVIDER_SITE_OTHER): Payer: Medicare Other | Admitting: Physical Medicine and Rehabilitation

## 2022-04-29 ENCOUNTER — Encounter: Payer: Self-pay | Admitting: Physical Medicine and Rehabilitation

## 2022-04-29 VITALS — BP 97/66 | HR 84

## 2022-04-29 DIAGNOSIS — M47816 Spondylosis without myelopathy or radiculopathy, lumbar region: Secondary | ICD-10-CM | POA: Diagnosis not present

## 2022-04-29 MED ORDER — METHYLPREDNISOLONE ACETATE 80 MG/ML IJ SUSP
80.0000 mg | Freq: Once | INTRAMUSCULAR | Status: AC
  Administered 2022-04-29: 80 mg

## 2022-04-29 NOTE — Progress Notes (Unsigned)
Pt state lower back pain that travels to her buttocks. Pt state laying down and getting up in the morning makes the pain worse. Pt state she takes over the counter pain meds to help ease her pain.  Numeric Pain Rating Scale and Functional Assessment Average Pain 2   In the last MONTH (on 0-10 scale) has pain interfered with the following?  1. General activity like being  able to carry out your everyday physical activities such as walking, climbing stairs, carrying groceries, or moving a chair?  Rating(10)   +Driver, -BT, -Dye Allergies.

## 2022-04-29 NOTE — Patient Instructions (Signed)

## 2022-05-02 NOTE — Progress Notes (Signed)
Kayla Becker - 71 y.o. female MRN [id number]  Date of birth: August 14, 1951  Office Visit Note: Visit Date: 04/29/2022 PCP: Lewis Moccasin, MD Referred by: Lewis Moccasin, MD  Subjective: Chief Complaint  Patient presents with   Lower Back - Pain   HPI:  Kayla Becker is a 71 y.o. female who comes in today at the request of Dr. Aldean Baker for planned Bilateral  L5-S1 Lumbar facet/medial branch block with fluoroscopic guidance.  The patient has failed conservative care including home exercise, medications, time and activity modification.  This injection will be diagnostic and hopefully therapeutic.  Please see requesting physician notes for further details and justification.  Exam has shown concordant pain with facet joint loading.   ROS Otherwise per HPI.  Assessment & Plan: Visit Diagnoses:    ICD-10-CM   1. Spondylosis without myelopathy or radiculopathy, lumbar region  M47.816 XR C-ARM NO REPORT    Facet Injection    methylPREDNISolone acetate (DEPO-MEDROL) injection 80 mg      Plan: No additional findings.   Meds & Orders:  Meds ordered this encounter  Medications   methylPREDNISolone acetate (DEPO-MEDROL) injection 80 mg    Orders Placed This Encounter  Procedures   Facet Injection   XR C-ARM NO REPORT    Follow-up: Return for visit to requesting provider as needed.   Procedures: No procedures performed  Lumbar Facet Joint Intra-Articular Injection(s) with Fluoroscopic Guidance  Patient: Kayla Becker      Date of Birth: [birth year] MRN: [id number] PCP: Lewis Moccasin, MD      Visit Date: 04/29/2022   Universal Protocol:    Date/Time: 04/29/2022  Consent Given By: the patient  Position: PRONE   Additional Comments: Vital signs were monitored before and after the procedure. Patient was prepped and draped in the usual sterile fashion. The correct patient, procedure, and site was verified.   Injection Procedure Details:  Procedure Site One Meds  Administered:  Meds ordered this encounter  Medications   methylPREDNISolone acetate (DEPO-MEDROL) injection 80 mg     Laterality: Bilateral  Location/Site:  L4-L5  Needle size: 22 guage  Needle type: Spinal  Needle Placement: Articular  Findings:  -Comments: Excellent flow of contrast producing a partial arthrogram.  Procedure Details: The fluoroscope beam is vertically oriented in AP, and the inferior recess is visualized beneath the lower pole of the inferior apophyseal process, which represents the target point for needle insertion. When direct visualization is difficult the target point is located at the medial projection of the vertebral pedicle. The region overlying each aforementioned target is locally anesthetized with a 1 to 2 ml. volume of 1% Lidocaine without Epinephrine.   The spinal needle was inserted into each of the above mentioned facet joints using biplanar fluoroscopic guidance. A 0.25 to 0.5 ml. volume of Isovue-250 was injected and a partial facet joint arthrogram was obtained. A single spot film was obtained of the resulting arthrogram.    One to 1.25 ml of the steroid/anesthetic solution was then injected into each of the facet joints noted above.   Additional Comments:  The patient tolerated the procedure well Dressing: 2 x 2 sterile gauze and Band-Aid    Post-procedure details: Patient was observed during the procedure. Post-procedure instructions were reviewed.  Patient left the clinic in stable condition.    Clinical History: No specialty comments available.     Objective:  VS:  HT:    WT:   BMI:     BP:97/66  HR:84bpm  TEMP: ( )  RESP:  Physical Exam Vitals and nursing note reviewed.  Constitutional:      General: She is not in acute distress.    Appearance: Normal appearance. She is not ill-appearing.  HENT:     Head: Normocephalic and atraumatic.     Right Ear: External ear normal.     Left Ear: External ear normal.  Eyes:      Extraocular Movements: Extraocular movements intact.  Cardiovascular:     Rate and Rhythm: Normal rate.     Pulses: Normal pulses.  Pulmonary:     Effort: Pulmonary effort is normal. No respiratory distress.  Abdominal:     General: There is no distension.     Palpations: Abdomen is soft.  Musculoskeletal:        General: Tenderness present.     Cervical back: Neck supple.     Right lower leg: No edema.     Left lower leg: No edema.     Comments: Patient has good distal strength with no pain over the greater trochanters.  No clonus or focal weakness.  Skin:    Findings: No erythema, lesion or rash.  Neurological:     General: No focal deficit present.     Mental Status: She is alert and oriented to person, place, and time.     Sensory: No sensory deficit.     Motor: No weakness or abnormal muscle tone.     Coordination: Coordination normal.  Psychiatric:        Mood and Affect: Mood normal.        Behavior: Behavior normal.      Imaging: No results found.

## 2022-05-02 NOTE — Procedures (Signed)
Lumbar Facet Joint Intra-Articular Injection(s) with Fluoroscopic Guidance  Patient: Kayla Becker      Date of Birth: 06/23/1951 MRN: [id number] PCP: Lewis Moccasin, MD      Visit Date: 04/29/2022   Universal Protocol:    Date/Time: 04/29/2022  Consent Given By: the patient  Position: PRONE   Additional Comments: Vital signs were monitored before and after the procedure. Patient was prepped and draped in the usual sterile fashion. The correct patient, procedure, and site was verified.   Injection Procedure Details:  Procedure Site One Meds Administered:  Meds ordered this encounter  Medications   methylPREDNISolone acetate (DEPO-MEDROL) injection 80 mg     Laterality: Bilateral  Location/Site:  L4-L5  Needle size: 22 guage  Needle type: Spinal  Needle Placement: Articular  Findings:  -Comments: Excellent flow of contrast producing a partial arthrogram.  Procedure Details: The fluoroscope beam is vertically oriented in AP, and the inferior recess is visualized beneath the lower pole of the inferior apophyseal process, which represents the target point for needle insertion. When direct visualization is difficult the target point is located at the medial projection of the vertebral pedicle. The region overlying each aforementioned target is locally anesthetized with a 1 to 2 ml. volume of 1% Lidocaine without Epinephrine.   The spinal needle was inserted into each of the above mentioned facet joints using biplanar fluoroscopic guidance. A 0.25 to 0.5 ml. volume of Isovue-250 was injected and a partial facet joint arthrogram was obtained. A single spot film was obtained of the resulting arthrogram.    One to 1.25 ml of the steroid/anesthetic solution was then injected into each of the facet joints noted above.   Additional Comments:  The patient tolerated the procedure well Dressing: 2 x 2 sterile gauze and Band-Aid    Post-procedure details: Patient was observed  during the procedure. Post-procedure instructions were reviewed.  Patient left the clinic in stable condition.

## 2022-05-04 IMAGING — MR MR HEAD WO/W CM
14 series · 48 of 48 positions shown · IV contrast (multihance)
Comparison: Head and face CT 10/07/2013.

CLINICAL DATA: 70-year-old female with persistent headaches and
dizziness, vertigo.

EXAM:
MRI HEAD WITHOUT AND WITH CONTRAST
TECHNIQUE: Multiplanar, multiecho pulse sequences of the brain and surrounding
structures were obtained without and with intravenous contrast.
CONTRAST:  14mL MULTIHANCE GADOBENATE DIMEGLUMINE 529 MG/ML IV SOLN

[Series 5: T1 · sagittal · 4.0mm · 0.75mm/px · 1 of 33 slices shown (1 of 3)]
[im 1/33]
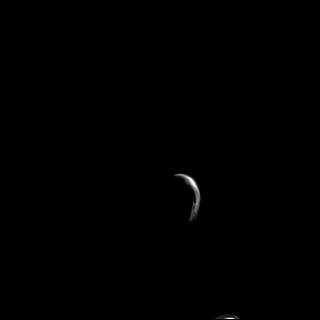

[Series 6: DWI · axial · 3.0mm · 0.94mm/px · z∈[-90,+65]mm · 7 of 176 slices shown (1 of 3)]
[im 1/176]
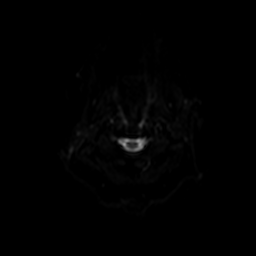
[im 30/176]
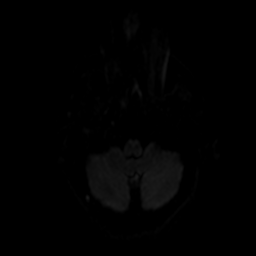
[im 59/176]
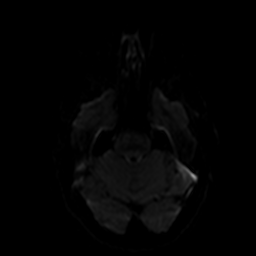
[im 88/176]
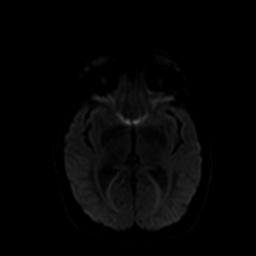
[im 117/176]
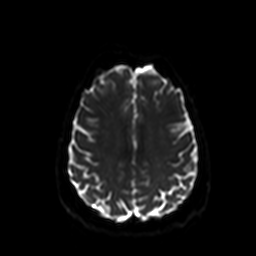
[im 146/176]
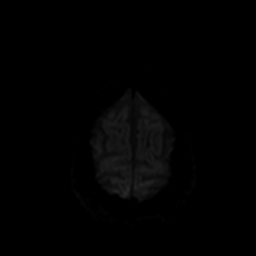
[im 176/176]
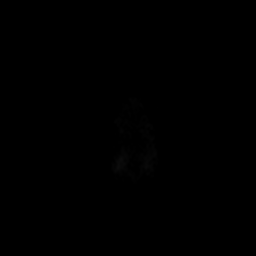

[Series 7: ax dwi_tracew · axial · 3.0mm · 0.94mm/px · z∈[-90,+65]mm · 4 of 88 slices shown]
[im 1/88]
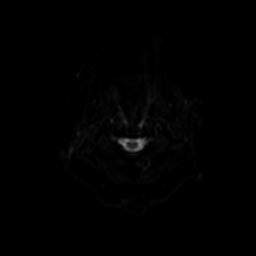
[im 30/88]
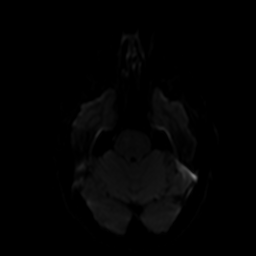
[im 59/88]
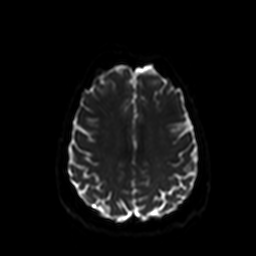
[im 88/88]
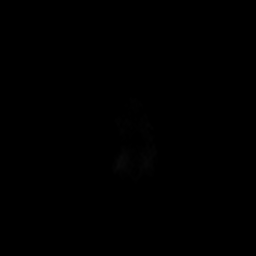

[Series 8: ax dwi_adc · axial · 3.0mm · 0.94mm/px · z∈[-90,+65]mm · 2 of 40 slices shown]
[im 1/40]
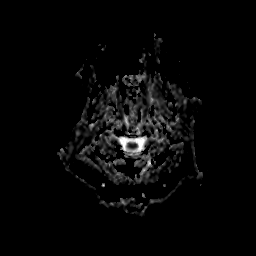
[im 40/40]
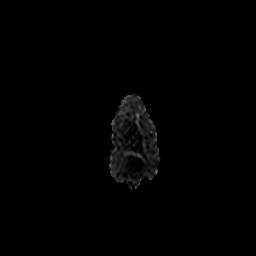

[Series 9: DWI · coronal · 5.0mm · 1.44mm/px · 3 of 70 slices shown (2 of 3)]
[im 1/70]
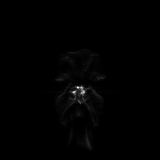
[im 35/70]
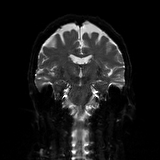
[im 70/70]
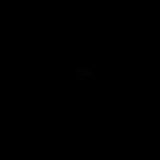

[Series 10: DWI · coronal · 5.0mm · 1.44mm/px · 2 of 35 slices shown (3 of 3)]
[im 1/35]
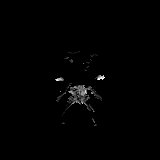
[im 35/35]
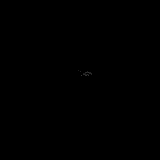

[Series 11: T2 · axial · 4.0mm · 0.36mm/px · 1 of 31 slices shown]
[im 1/31]
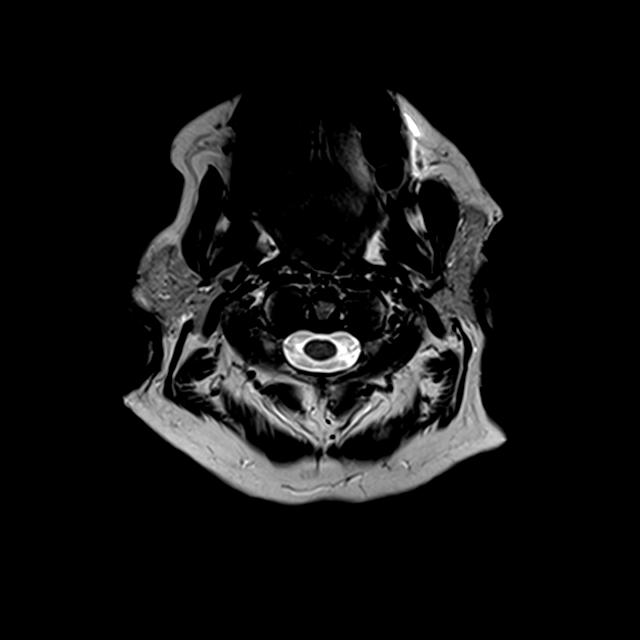

[Series 12: FLAIR · axial · 3.0mm · 0.72mm/px · 1 of 26 slices shown]
[im 1/26]
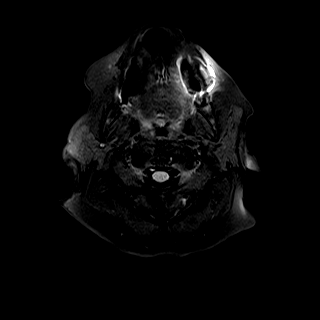

[Series 13: swi_images · axial · 1.5mm · 0.90mm/px · z∈[-87,+56]mm · 5 of 96 slices shown]
[im 1/96]
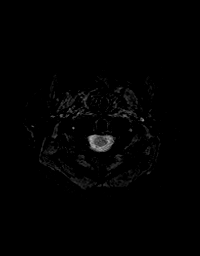
[im 24/96]
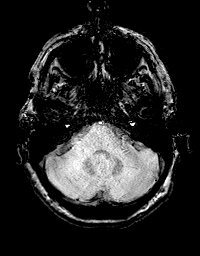
[im 48/96]
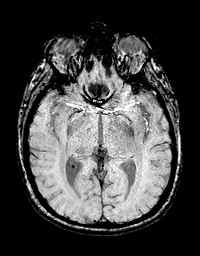
[im 72/96]
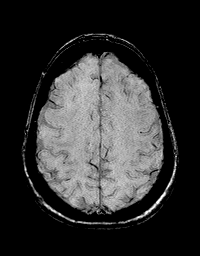
[im 96/96]
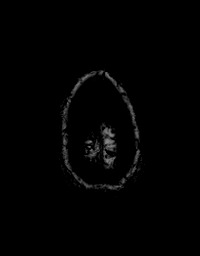

[Series 15: T1 · axial · 1.0mm · 0.94mm/px · z∈[-90,+69]mm · 8 of 160 slices shown (2 of 3)]
[im 1/160]
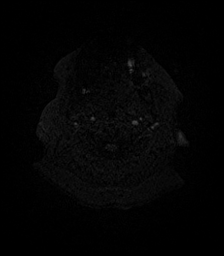
[im 23/160]
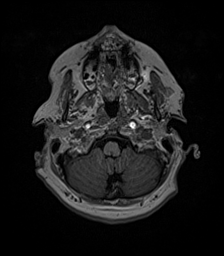
[im 46/160]
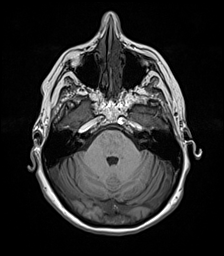
[im 69/160]
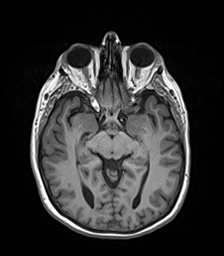
[im 91/160]
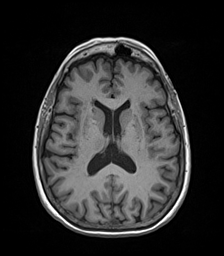
[im 114/160]
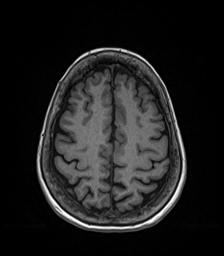
[im 137/160]
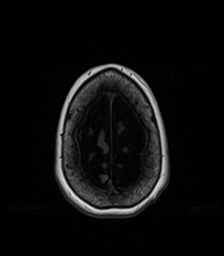
[im 160/160]
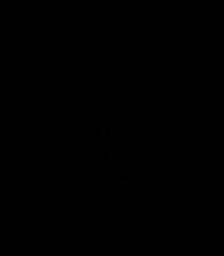

[Series 16: T2 post-contrast · coronal · 4.0mm · 0.36mm/px · 2 of 37 slices shown]
[im 1/37]
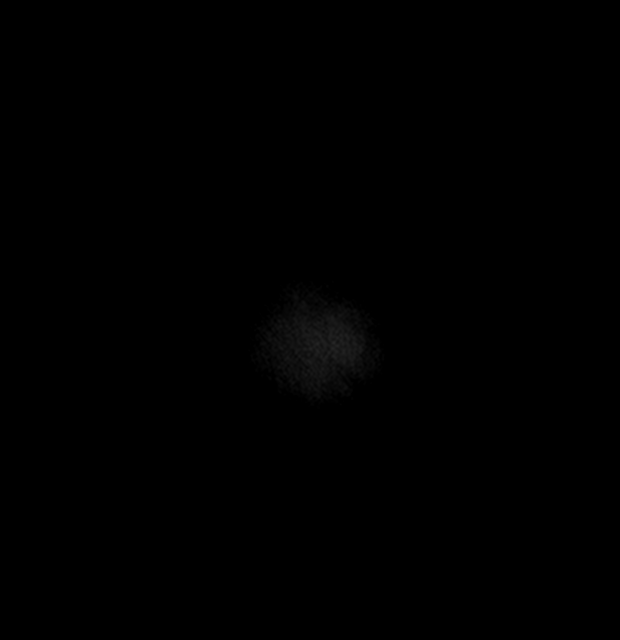
[im 37/37]
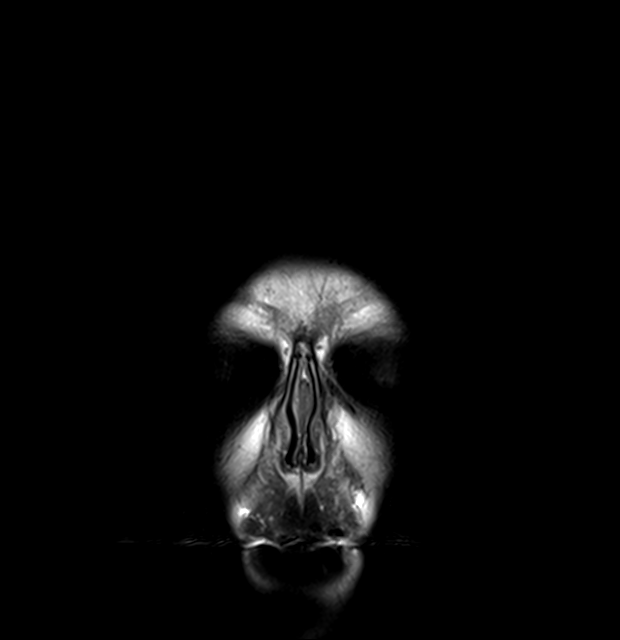

[Series 17: T1 · axial · 1.0mm · 0.94mm/px · z∈[-90,+69]mm · 8 of 160 slices shown (3 of 3)]
[im 1/160]
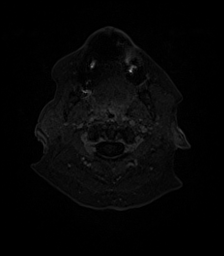
[im 23/160]
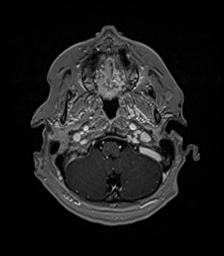
[im 46/160]
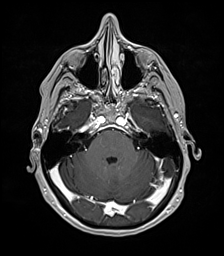
[im 69/160]
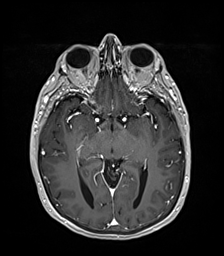
[im 91/160]
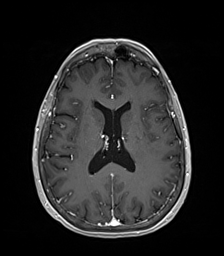
[im 114/160]
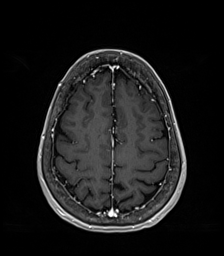
[im 137/160]
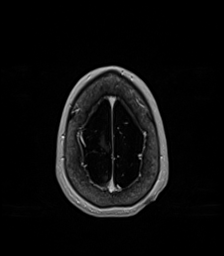
[im 160/160]
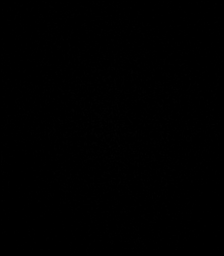

[Series 18: T1 post-contrast · coronal · 4.0mm · 0.72mm/px · 2 of 37 slices shown (1 of 2)]
[im 1/37]
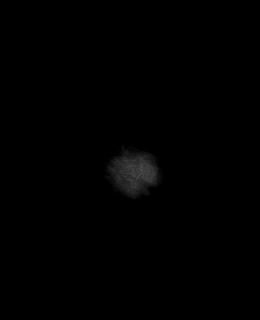
[im 37/37]
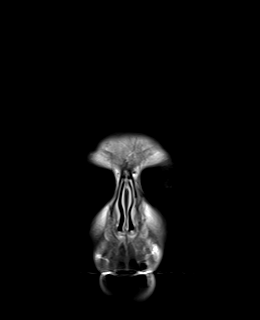

[Series 19: T1 post-contrast · sagittal · 4.0mm · 0.75mm/px · 2 of 33 slices shown (2 of 2)]
[im 1/33]
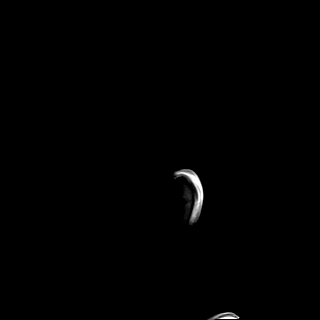
[im 33/33]
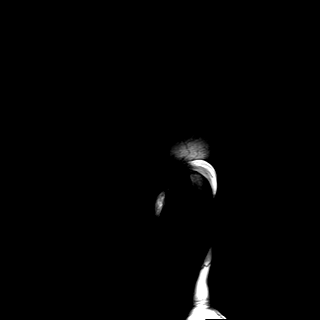

[48 of 48 positions shown; findings below may reference images not displayed]

FINDINGS: Brain: Cerebral volume is within normal limits for age. No
restricted diffusion to suggest acute infarction. No midline shift,
mass effect, evidence of mass lesion, ventriculomegaly, extra-axial
collection or acute intracranial hemorrhage. Cervicomedullary
junction and pituitary are within normal limits.

Largely normal for age gray and white matter signal throughout the
brain. Minimal scattered punctate cerebral white matter T2 and FLAIR
hyperintensity. There is mild T2 heterogeneity in both thalami and
the pons. No cortical encephalomalacia or chronic cerebral blood
products. Cerebellum remains normal.

No abnormal enhancement identified.  No dural thickening.

Vascular: Major intracranial vascular flow voids are preserved, the
vertebrobasilar system appears to be diminutive on the basis of
fetal type bilateral PCA origins. And furthermore, there is evidence
of a persistent left side trigeminal artery (series 17, image 52 and
series 16, image 21), a rare anatomic variant. The major dural
venous sinuses are enhancing and appear to be patent.

Skull and upper cervical spine: Normal visible cervical spine.
Visualized bone marrow signal is within normal limits.

Sinuses/Orbits: Postoperative changes to both globes, otherwise
negative.

Other: Mastoid air cells are clear. Visible internal auditory
structures appear normal. Normal stylomastoid foramina. Negative
visible scalp and face.
IMPRESSION: 1. No acute intracranial abnormality.
2. Diminutive vertebrobasilar vascular system secondary to a
persistent left side trigeminal artery (rare normal variant) and
fetal type PCA origins (common normal variant).
3. But largely normal for age MRI appearance of the brain, with mild
nonspecific signal changes (most pronounced pronounced in the pons)
which might be due to chronic small vessel disease.

## 2022-08-28 ENCOUNTER — Ambulatory Visit
Admission: RE | Admit: 2022-08-28 | Discharge: 2022-08-28 | Disposition: A | Discharge status: Home / Self Care | Payer: Medicare Other | Source: Ambulatory Visit | Attending: Internal Medicine | Admitting: Internal Medicine

## 2022-08-28 DIAGNOSIS — Z13820 Encounter for screening for osteoporosis: Secondary | ICD-10-CM

## 2022-08-28 DIAGNOSIS — Z1231 Encounter for screening mammogram for malignant neoplasm of breast: Secondary | ICD-10-CM

## 2023-05-20 ENCOUNTER — Ambulatory Visit (INDEPENDENT_AMBULATORY_CARE_PROVIDER_SITE_OTHER): Payer: Medicare Other | Admitting: Internal Medicine

## 2023-05-20 ENCOUNTER — Encounter: Payer: Self-pay | Admitting: Internal Medicine

## 2023-05-20 ENCOUNTER — Ambulatory Visit (HOSPITAL_COMMUNITY)
Admission: RE | Admit: 2023-05-20 | Discharge: 2023-05-20 | Disposition: A | Discharge status: Home / Self Care | Payer: Medicare Other | Source: Ambulatory Visit | Attending: Internal Medicine | Admitting: Internal Medicine

## 2023-05-20 VITALS — BP 120/95 | HR 63 | Ht 63.5 in | Wt 154.0 lb

## 2023-05-20 DIAGNOSIS — R058 Other specified cough: Secondary | ICD-10-CM | POA: Insufficient documentation

## 2023-05-20 MED ORDER — ACETAMINOPHEN-CODEINE 300-30 MG PO TABS: 1.0000 | ORAL_TABLET | ORAL | 0 refills | Status: AC | PRN

## 2023-05-20 MED ORDER — FAMOTIDINE 20 MG PO TABS: ORAL_TABLET | ORAL | 11 refills | Status: DC

## 2023-05-20 MED ORDER — METHYLPREDNISOLONE ACETATE 80 MG/ML IJ SUSP
80.0000 mg | Freq: Once | INTRAMUSCULAR | Status: AC
  Administered 2023-05-20: 80 mg via INTRAMUSCULAR

## 2023-05-20 MED ORDER — PANTOPRAZOLE SODIUM 40 MG PO TBEC: 40.0000 mg | DELAYED_RELEASE_TABLET | Freq: Every day | ORAL | 2 refills | Status: DC

## 2023-05-20 NOTE — Progress Notes (Signed)
Kayla Becker, female    DOB: 04-18-1951    MRN: [id number]   Brief patient profile:  25  yowf  never smoker  self referred to pulmonary clinic in [facility]  05/20/2023 for recurrent cough.  Had same problem problem in [facility] around 2010 and then again 2016 started on nortriptylline up 10 pills at night until Aug 2024 onset with covid rx paxlovid early August       History of Present Illness  05/20/2023  Pulmonary/ 1st office eval/ Yuki Brunsman / Sales executive Complaint  Patient presents with   Cough  Dyspnea:  works out in gym no problem with sob/ just cough  Cough:dry harsh/assoc gen ant chest discomfort/ worse with  activity / laughing/  strong smells / eating  Sleep: bed is flat / 2 pillows  SABA use: ? Helps  02: none   No obvious day to day or daytime pattern/variability or assoc excess/ purulent sputum or mucus plugs or hemoptysis or  , subjective wheeze or overt sinus or hb symptoms.    Also denies any obvious fluctuation of symptoms with weather or environmental changes or other aggravating or alleviating factors except as outlined above   No unusual exposure hx or h/o childhood pna/ asthma or knowledge of premature birth.  Current Allergies, Complete Past Medical History, Past Surgical History, Family History, and Social History were reviewed in [employer] record.  ROS  The following are not active complaints unless bolded Hoarseness, sore throat, dysphagia, dental problems, itching, sneezing,  nasal congestion or discharge of excess mucus or purulent secretions, ear ache,   fever, chills, sweats, unintended wt loss or wt gain, classically pleuritic or exertional cp,  orthopnea pnd or arm/hand swelling  or leg swelling, presyncope, palpitations, abdominal pain, anorexia, nausea, vomiting, diarrhea  or change in bowel habits or change in bladder habits, change in stools or change in urine, dysuria, hematuria,  rash, arthralgias, visual complaints,  headache, numbness, weakness or ataxia or problems with walking or coordination,  change in mood or  memory.              Outpatient Medications Prior to Visit  Medication Sig Dispense Refill   Calcium-Vitamin D (CALTRATE 600 PLUS-VIT D PO) Take by mouth.     carvedilol (COREG) 6.25 MG tablet      Cholecalciferol (VITAMIN D3 PO) Take by mouth. 5,000 mg once a day     losartan (COZAAR) 100 MG tablet Take 100 mg by mouth daily.     nortriptyline (PAMELOR) 10 MG capsule      Omega-3 Fatty Acids (FISH OIL PO) Take by mouth. 2,000 mg once a day     Probiotic Product (PROBIOTIC DAILY PO) Take by mouth. Once a day     rosuvastatin (CRESTOR) 10 MG tablet Take 10 mg by mouth daily.     SYNTHROID 100 MCG tablet Take 100 mcg by mouth every morning.     albuterol (VENTOLIN HFA) 108 (90 Base) MCG/ACT inhaler  (Patient not taking: Reported on 05/20/2023)     esomeprazole (NEXIUM) 40 MG capsule Take 40 mg by mouth daily before breakfast.     metFORMIN (GLUCOPHAGE) 500 MG tablet Take 500 mg by mouth 2 (two) times daily with a meal.     metoprolol succinate (TOPROL-XL) 25 MG 24 hr tablet Take 25 mg by mouth daily.     ondansetron (ZOFRAN) 4 MG tablet Take 1 tablet (4 mg total) by mouth every 6 (six) hours. 12 tablet  0   thyroid (ARMOUR) 90 MG tablet Take 90 mg by mouth daily.     No facility-administered medications prior to visit.    Past Medical History:  Diagnosis Date   Asthma    Diabetes mellitus without complication (HCC)    Hyperlipidemia    Hypertension    Tachycardia    Thyroid disease       Objective:     BP (!) 120/95   Pulse 63   Ht 5' 3.5" (1.613 m)   Wt 154 lb (69.9 kg)   SpO2 92% Comment: RA  BMI 26.85 kg/m   SpO2: 92 % (RA)  Amb wf nad   HEENT : Oropharynx  nl      Nasal turbinates nl    NECK :  without  apparent JVD/ palpable Nodes/TM    LUNGS: no acc muscle use,  Nl contour chest which is clear to A and P bilaterally without cough on insp or exp  maneuvers   CV:  RRR  no s3 or murmur or increase in P2, and no edema   ABD:  soft and nontender with nl inspiratory excursion in the supine position. No bruits or organomegaly appreciated   MS:  Nl gait/ ext warm without deformities Or obvious joint restrictions  calf tenderness, cyanosis or clubbing    SKIN: warm and dry without lesions    NEURO:  alert, approp, nl sensorium with  no motor or cerebellar deficits apparent.    I personally reviewed images and agree with radiology impression as follows:  CXR:   mild kyphosis/ no acute findings     Assessment   Upper airway cough syndrome Recurrent pattern x 2010 eval [facility] 2016 rex pamelor hs seemed to help the most - cyclical pattern since covid early Aug 2024 > cyclical cough rx 05/20/2023 >>>  Upper airway cough syndrome (previously labeled PNDS),  is so named because it's frequently impossible to sort out how much is  CR/sinusitis with freq throat clearing (which can be related to primary GERD)   vs  causing  secondary (" extra esophageal")  GERD from wide swings in gastric pressure that occur with throat clearing, often  promoting self use of mint and menthol lozenges that reduce the lower esophageal sphincter tone and exacerbate the problem further in a cyclical fashion.   These are the same pts (now being labeled as having "irritable larynx syndrome" by some cough centers) who not infrequently have a history of having failed to tolerate ace inhibitors,  dry powder inhalers or biphosphonates or report having atypical/extraesophageal reflux symptoms that don't respond to standard doses of PPI  and are easily confused as having aecopd or asthma flares by even experienced allergists/ pulmonologists (myself included).   Of the three most common causes of  Sub-acute / recurrent or chronic cough, only one (GERD)  can actually contribute to/ trigger  the other two (asthma and post nasal drip syndrome)  and perpetuate the cylce of  cough.  While not intuitively obvious, many patients with chronic low grade reflux do not cough until there is a primary insult that disturbs the protective epithelial barrier and exposes sensitive nerve endings.   This is typically viral but can due to PNDS and  either may apply here.   The point is that once this occurs, it is difficult to eliminate the cycle  using anything but a maximally effective acid suppression regimen at least in the short run, accompanied by an appropriate diet to address non  acid GERD and control / eliminate the cough itself for at least 3 days with tylenol #3 and depomedrol 120 mg IM   in case of component of Th-2 driven upper or lower airways inflammation (if cough responds short term only to relapse before return while will on full rx for uacs (as above), then  that would point to allergic rhinitis/ asthma or eos bronchitis as alternative dx)     F/u in 4-6 weeks to look at long term options / needs  with all meds in hand using a trust but verify approach to confirm accurate Medication  Reconciliation The principal here is that until we are certain that the  patients are doing what we've asked, it makes no sense to ask them to do more.          Each maintenance medication was reviewed in detail including emphasizing most importantly the difference between maintenance and prns and under what circumstances the prns are to be triggered using an action plan format where appropriate.  Total time for H and P, chart review, counseling,  and generating customized AVS unique to this office visit / same day charting  > 45 min for  refractory respiratory  symptoms of uncertain etiology with pt new to me          Sandrea Hughs, MD 05/20/2023

## 2023-05-20 NOTE — Assessment & Plan Note (Signed)
Recurrent pattern x 2010 eval [facility] 2016 rex pamelor hs seemed to help the most - cyclical pattern since covid early Aug 2024 > cyclical cough rx 05/20/2023 >>>  Upper airway cough syndrome (previously labeled PNDS),  is so named because it's frequently impossible to sort out how much is  CR/sinusitis with freq throat clearing (which can be related to primary GERD)   vs  causing  secondary (" extra esophageal")  GERD from wide swings in gastric pressure that occur with throat clearing, often  promoting self use of mint and menthol lozenges that reduce the lower esophageal sphincter tone and exacerbate the problem further in a cyclical fashion.   These are the same pts (now being labeled as having "irritable larynx syndrome" by some cough centers) who not infrequently have a history of having failed to tolerate ace inhibitors,  dry powder inhalers or biphosphonates or report having atypical/extraesophageal reflux symptoms that don't respond to standard doses of PPI  and are easily confused as having aecopd or asthma flares by even experienced allergists/ pulmonologists (myself included).   Of the three most common causes of  Sub-acute / recurrent or chronic cough, only one (GERD)  can actually contribute to/ trigger  the other two (asthma and post nasal drip syndrome)  and perpetuate the cylce of cough.  While not intuitively obvious, many patients with chronic low grade reflux do not cough until there is a primary insult that disturbs the protective epithelial barrier and exposes sensitive nerve endings.   This is typically viral but can due to PNDS and  either may apply here.   The point is that once this occurs, it is difficult to eliminate the cycle  using anything but a maximally effective acid suppression regimen at least in the short run, accompanied by an appropriate diet to address non acid GERD and control / eliminate the cough itself for at least 3 days with tylenol #3 and depomedrol 120 mg IM   in  case of component of Th-2 driven upper or lower airways inflammation (if cough responds short term only to relapse before return while will on full rx for uacs (as above), then  that would point to allergic rhinitis/ asthma or eos bronchitis as alternative dx)     F/u in 4-6 weeks to look at long term options / needs  with all meds in hand using a trust but verify approach to confirm accurate Medication  Reconciliation The principal here is that until we are certain that the  patients are doing what we've asked, it makes no sense to ask them to do more.          Each maintenance medication was reviewed in detail including emphasizing most importantly the difference between maintenance and prns and under what circumstances the prns are to be triggered using an action plan format where appropriate.  Total time for H and P, chart review, counseling,  and generating customized AVS unique to this office visit / same day charting  > 45 min for  refractory respiratory  symptoms of uncertain etiology with pt new to me

## 2023-05-20 NOTE — Patient Instructions (Addendum)
Continue nortritylline   The key to effective treatment for your cough is eliminating the non-stop cycle of cough you're stuck in long enough to let your airway heal completely and then see if there is anything still making you cough once you stop the cough suppression, but this should take no more than 5 days to figure out  First take delsym two tsp every 12 hours and supplement if needed with  tylenol #3  1- 2 every 4 hours to suppress the urge to cough at all or even clear your throat. Swallowing water or using ice chips/non mint and menthol containing candies (such as lifesavers or sugarless jolly ranchers) are also effective.  You should rest your voice and avoid activities that you know make you cough.  Once you have eliminated the cough for 3 straight days try reducing the tylenol #3 first,  then the delsym as tolerated.    Depomedrol 120 mg IM   Protonix (pantoprazole) Take 30-60 min before first meal of the day and Pepcid 20 mg one bedtime plus chlorpheniramine 4 mg x 2 at bedtime (both available over the counter)  until cough is completely gone for at least a week without the need for cough suppression  GERD (REFLUX)  is an extremely common cause of respiratory symptoms, many times with no significant heartburn at all.    It can be treated with medication, but also with lifestyle changes including avoidance of late meals, excessive alcohol, smoking cessation, and avoid fatty foods, chocolate, peppermint, colas, red wine, and acidic juices such as orange juice.  NO MINT OR MENTHOL PRODUCTS SO NO COUGH DROPS - Luden's cough drops ok USE HARD CANDY INSTEAD (jolley ranchers or Stover's or Lifesavers (all available in sugarless versions) NO OIL BASED VITAMINS - use powdered substitutes. NO fish oil while coughing   Please remember to go to the  x-ray department  @  [facility] for your tests - we will call you with the results when they are available     Ok to see me in [place]  in October if any openings otherwise see me here.

## 2023-06-12 ENCOUNTER — Ambulatory Visit (INDEPENDENT_AMBULATORY_CARE_PROVIDER_SITE_OTHER): Payer: Medicare Other | Admitting: Internal Medicine

## 2023-06-12 ENCOUNTER — Encounter: Payer: Self-pay | Admitting: Internal Medicine

## 2023-06-12 VITALS — BP 115/70 | HR 71 | Ht 63.5 in | Wt 150.0 lb

## 2023-06-12 DIAGNOSIS — R058 Other specified cough: Secondary | ICD-10-CM | POA: Diagnosis not present

## 2023-06-12 MED ORDER — METHYLPREDNISOLONE ACETATE 80 MG/ML IJ SUSP
80.0000 mg | Freq: Once | INTRAMUSCULAR | Status: AC
  Administered 2023-06-12: 80 mg via INTRAMUSCULAR

## 2023-06-12 MED ORDER — GABAPENTIN 100 MG PO CAPS: 100.0000 mg | ORAL_CAPSULE | Freq: Four times a day (QID) | ORAL | 2 refills | Status: DC

## 2023-06-12 NOTE — Addendum Note (Signed)
Addended by: Winn Jock on: 06/12/2023 08:59 AM   Modules accepted: Orders

## 2023-06-12 NOTE — Patient Instructions (Addendum)
Depomedrol 120 mg IM  today   Ok to use tylenol #3 if can't stop coughing  after first starting back on delsym 1-2 tsp every 12 hours   If cough no better > gabapentin 100 mg twice daily for a week then three times daily for a week then 4x daily until return   If better from the shot (meaning you don't need Tylenol #3) then we can given you a trial of inhaled form  prednisone can call you in symbicort 80 2 every 12 hours until you return to let me help you learn how to use it most effectively   Please schedule a follow up office visit in 4 weeks, call sooner if needed with all medications /inhalers/ solutions in hand so we can verify exactly what you are taking. This includes all medications from all doctors and over the counters - PLEASE separate them into two bags:  the ones you take automatically, no matter what, vs the ones you take just when you feel you need them "BAG #2 is UP TO YOU"  - this will really help [place] help you take your medications more effectively.

## 2023-06-12 NOTE — Progress Notes (Signed)
Kayla Becker, female    DOB: 02-05-1951    MRN: [id number]   Brief patient profile:  20  yowf  never smoker  self referred to pulmonary clinic in [facility]  05/20/2023 for recurrent cough.  Had same problem problem in [facility] around 2010 and then again 2016 started on nortriptylline up 10 pills at night until Aug 2024 onset with covid rx paxlovid early August       History of Present Illness  05/20/2023  Pulmonary/ 1st office eval/ Maize Brittingham / Sales executive Complaint  Patient presents with   Cough  Dyspnea:  works out in gym no problem with sob/ just cough  Cough:dry harsh/assoc gen ant chest discomfort/ worse with  activity / laughing/  strong smells / eating  Sleep: bed is flat / 2 pillows  SABA use: ? Helps  02: none  Rec Continue nortritylline  First take delsym two tsp every 12 hours and supplement if needed with  tylenol #3  1- 2 every 4 hours to suppress the urge to cough at all or even clear your throatOnce you have eliminated the cough for 3 straight days try reducing the tylenol #3 first,  then the delsym as tolerated.   Depomedrol 120 mg IM  Protonix (pantoprazole) Take 30-60 min before first meal of the day and Pepcid 20 mg one bedtime plus chlorpheniramine 4 mg x 2 at bedtime (both available over the counter)  until cough is completely gone for at least a week without the need for cough suppression GERD diet reviewed, bed blocks rec     06/12/2023  f/u ov/[place] office/Danamarie Minami re: recurrent cough x 2010 flared p covid in 04/2023  maint on reflux meds and nortipyline 70 mg at bedtime  Chief Complaint  Patient presents with   Consult  Completely gone until 2 d prior to ov  Dyspnea:  no problems working out at gym mostly wts , never short of breath  Cough: started back up 06/10/23 in am p stirring sporadic Sleeping: flat bed 2 pillows s    resp cc  SABA use: not using  02: none      No obvious day to day or daytime variability or assoc excess/ purulent sputum or  mucus plugs or hemoptysis or cp or chest tightness, subjective wheeze or overt sinus or hb symptoms.    Also denies any obvious fluctuation of symptoms with weather or environmental changes or other aggravating or alleviating factors except as outlined above   No unusual exposure hx or h/o childhood pna/ asthma or knowledge of premature birth.  Current Allergies, Complete Past Medical History, Past Surgical History, Family History, and Social History were reviewed in [employer] record.  ROS  The following are not active complaints unless bolded Hoarseness, sore throat, dysphagia, dental problems, itching, sneezing,  nasal congestion or discharge of excess mucus or purulent secretions, ear ache,   fever, chills, sweats, unintended wt loss or wt gain, classically pleuritic or exertional cp,  orthopnea pnd or arm/hand swelling  or leg swelling, presyncope, palpitations, abdominal pain, anorexia, nausea, vomiting, diarrhea  or change in bowel habits or change in bladder habits, change in stools or change in urine, dysuria, hematuria,  rash, arthralgias much better p depo, much worse with cough return , visual complaints, headache, numbness, weakness or ataxia or problems with walking or coordination,  change in mood or  memory.        Current Meds  Medication Sig   Calcium-Vitamin D (  CALTRATE 600 PLUS-VIT D PO) Take by mouth.   carvedilol (COREG) 6.25 MG tablet    Cholecalciferol (VITAMIN D3 PO) Take by mouth. 5,000 mg once a day   famotidine (PEPCID) 20 MG tablet One after supper   losartan (COZAAR) 100 MG tablet Take 100 mg by mouth daily.   nortriptyline (PAMELOR) 10 MG capsule    pantoprazole (PROTONIX) 40 MG tablet Take 1 tablet (40 mg total) by mouth daily. Take 30-60 min before first meal of the day   Probiotic Product (PROBIOTIC DAILY PO) Take by mouth. Once a day   rosuvastatin (CRESTOR) 10 MG tablet Take 10 mg by mouth daily.   SYNTHROID 100 MCG tablet Take 100  mcg by mouth every morning.          Past Medical History:  Diagnosis Date   Asthma    Diabetes mellitus without complication (HCC)    Hyperlipidemia    Hypertension    Tachycardia    Thyroid disease       Objective:    06/12/2023      150 05/20/23 154 lb (69.9 kg)  01/18/17 155 lb (70.3 kg)  07/18/14 164 lb (74.4 kg)      Vital signs reviewed  06/12/2023  - Note at rest 02 sats  94% on RA   General appearance:    amb wf nad   HEENT : Oropharynx  nl      Nasal turbinates nl    NECK :  without  apparent JVD/ palpable Nodes/TM    LUNGS: no acc muscle use,  Nl contour chest which is clear to A and P bilaterally without cough on insp or exp maneuvers   CV:  RRR  no s3 or murmur or increase in P2, and no edema   ABD:  soft and nontender with nl inspiratory excursion in the supine position. No bruits or organomegaly appreciated   MS:  Nl gait/ ext warm without deformities Or obvious joint restrictions  calf tenderness, cyanosis or clubbing    SKIN: warm and dry without lesions    NEURO:  alert, approp, nl sensorium with  no motor or cerebellar deficits apparent.         Assessment

## 2023-06-12 NOTE — Assessment & Plan Note (Signed)
Recurrent pattern x 2010 eval [facility] 2016 rex pamelor hs seemed to help the most - cyclical pattern since covid early Aug 2024 > cyclical cough rx 05/20/2023 >>> resolved until 06/10/23  - Allergy screen 06/12/2023 >  Eos 0. /  IgE      Very dramatic response  "best ever" to cyclical cough rx that maintained control p stopped tyl #3 so there may have been a benefit to the depomedrol  on a  component of Th-2 driven upper or lower airways inflammation (if cough responds short term only to relapse before return while will on full rx for uacs (as above), then  that would point to allergic rhinitis/ asthma or eos bronchitis as alternative dx)   Rec  Repeat the depomedrol 120 mg IM Check labs as above plus ESR since helped her arthritis so much too If better from depo > symbicort 80 2bid (like applying creatm to a rash, only works if it hits the target tissue If not > gabapentin 100 increasing to qid prior to return with all meds in hand using a trust but verify approach to confirm accurate Medication  Reconciliation The principal here is that until we are certain that the  patients are doing what we've asked, it makes no sense to ask them to do more.          Each maintenance medication was reviewed in detail including emphasizing most importantly the difference between maintenance and prns and under what circumstances the prns are to be triggered using an action plan format where appropriate.  Total time for H and P, chart review, counseling, reviewing hfa  device(s) and generating customized AVS unique to this office visit / same day charting = 

## 2023-06-14 LAB — CBC WITH DIFFERENTIAL/PLATELET
Basophils Absolute: 0.1 10*3/uL (ref 0.0–0.2)
Basos: 1 %
EOS (ABSOLUTE): 0.1 10*3/uL (ref 0.0–0.4)
Eos: 2 %
Hematocrit: 39.3 % (ref 34.0–46.6)
Hemoglobin: 13.2 g/dL (ref 11.1–15.9)
Immature Grans (Abs): 0.1 10*3/uL (ref 0.0–0.1)
Immature Granulocytes: 2 %
Lymphocytes Absolute: 2.8 10*3/uL (ref 0.7–3.1)
Lymphs: 37 %
MCH: 34.7 pg — ABNORMAL HIGH (ref 26.6–33.0)
MCHC: 33.6 g/dL (ref 31.5–35.7)
MCV: 103 fL — ABNORMAL HIGH (ref 79–97)
Monocytes Absolute: 0.9 10*3/uL (ref 0.1–0.9)
Monocytes: 11 %
Neutrophils Absolute: 3.8 10*3/uL (ref 1.4–7.0)
Neutrophils: 47 %
Platelets: 205 10*3/uL (ref 150–450)
RBC: 3.8 x10E6/uL (ref 3.77–5.28)
RDW: 11.9 % (ref 11.7–15.4)
WBC: 7.7 10*3/uL (ref 3.4–10.8)

## 2023-06-14 LAB — IGE: IgE (Immunoglobulin E), Serum: 70 [IU]/mL (ref 6–495)

## 2023-06-14 LAB — SEDIMENTATION RATE: Sed Rate: 2 mm/h (ref 0–40)

## 2023-07-11 ENCOUNTER — Encounter: Payer: Self-pay | Admitting: Internal Medicine

## 2023-07-11 ENCOUNTER — Ambulatory Visit (INDEPENDENT_AMBULATORY_CARE_PROVIDER_SITE_OTHER): Payer: Medicare Other | Admitting: Internal Medicine

## 2023-07-11 VITALS — BP 125/75 | HR 70 | Ht 63.5 in | Wt 153.0 lb

## 2023-07-11 DIAGNOSIS — R058 Other specified cough: Secondary | ICD-10-CM

## 2023-07-11 NOTE — Patient Instructions (Addendum)
Ok to stop the  delsym first then after a week  acid pills   (pantoprazole and famotidine)   Change  now gabapentin to 100 mg x 2   bfast and supper   =  400 mg per day  for several weeks if doing great  then try 100 mg twice daily until used all up    If you are satisfied with your treatment plan,  let your doctor know and he/she can either refill your medications or you can return here when your prescription runs out.     If in any way you are not 100% satisfied,  please tell us.  If 100% better, tell your friends!  Pulmonary follow up is as needed    Add : zyrtec prn for sneezing > allergy referral prn

## 2023-07-11 NOTE — Progress Notes (Unsigned)
Kayla Becker, female    DOB: 14-Feb-1951    MRN: [id number]   Brief patient profile:  75  yowf  from [facility]  never smoker  self referred to pulmonary clinic in [facility]  05/20/2023 for recurrent cough.  Had same problem problem in [facility] around 2010 and then again 2016 started on nortriptylline up 10 pills at night which seemed to work ok until early Aug 2024 onset with covid rx paxlovid       History of Present Illness  05/20/2023  Pulmonary/ 1st office eval/ Mirranda Monrroy / Sales executive Complaint  Patient presents with   Cough  Dyspnea:  works out in gym no problem with sob/ just cough  Cough:dry harsh/assoc gen ant chest discomfort/ worse with  activity / laughing/  strong smells / eating  Sleep: bed is flat / 2 pillows  SABA use: ? Helps  02: none  Rec Continue nortritylline  First take delsym two tsp every 12 hours and supplement if needed with  tylenol #3  1- 2 every 4 hours to suppress the urge to cough at all or even clear your throatOnce you have eliminated the cough for 3 straight days try reducing the tylenol #3 first,  then the delsym as tolerated.   Depomedrol 120 mg IM  Protonix (pantoprazole) Take 30-60 min before first meal of the day and Pepcid 20 mg one bedtime plus chlorpheniramine 4 mg x 2 at bedtime (both available over the counter)  until cough is completely gone for at least a week without the need for cough suppression GERD diet reviewed, bed blocks rec     06/12/2023  f/u ov/[place] office/Merita Hawks re: recurrent cough x 2010 flared p covid in 04/2023  maint on reflux meds and nortipyline 70 mg at bedtime  Chief Complaint  Patient presents with   Consult  Completely gone until 2 d prior to ov  Dyspnea:  no problems working out at gym mostly wts , never short of breath  Cough: started back up 06/10/23 in am p stirring sporadic Sleeping: flat bed 2 pillows s    resp cc  SABA use: not using  02: none  Rec Depomedrol 120 mg IM  today  Ok to use tylenol #3 if  can't stop coughing  after first starting back on delsym 1-2 tsp every 12 hours  If cough no better > gabapentin 100 mg twice daily for a week then three times daily for a week then 4x daily until return  If better from the shot (meaning you don't need Tylenol #3) then we can given you a trial of inhaled form  prednisone can call you in symbicort 80 2 every 12 hours until you return to let me help you learn how to use it most effectively  Please schedule a follow up office visit in 4 weeks, call sooner if needed with all medications /inhalers/ solutions in hand    07/11/2023  f/u ov/[place] office/Lesley Atkin re: recurrent uacs  maint on gabapentin 100 mg qid   Chief Complaint  Patient presents with   Cough  Dyspnea:  still working at the gym  Cough: gone  Sleeping: flat bed 2 pillows s resp cc  SABA use: none  02: none      No obvious day to day or daytime variability or assoc excess/ purulent sputum or mucus plugs or hemoptysis or cp or chest tightness, subjective wheeze or overt sinus or hb symptoms.    Also denies any obvious fluctuation of  symptoms with weather or environmental changes or other aggravating or alleviating factors except as outlined above   No unusual exposure hx or h/o childhood pna/ asthma or knowledge of premature birth.  Current Allergies, Complete Past Medical History, Past Surgical History, Family History, and Social History were reviewed in [employer] record.  ROS  The following are not active complaints unless bolded Hoarseness, sore throat, dysphagia, dental problems, itching, sneezing,  nasal congestion or discharge of excess mucus or purulent secretions, ear ache,   fever, chills, sweats, unintended wt loss or wt gain, classically pleuritic or exertional cp,  orthopnea pnd or arm/hand swelling  or leg swelling, presyncope, palpitations, abdominal pain, anorexia, nausea, vomiting, diarrhea  or change in bowel habits or change in bladder  habits, change in stools or change in urine, dysuria, hematuria,  rash, arthralgias, visual complaints, headache, numbness, weakness or ataxia or problems with walking or coordination,  change in mood or  memory.        Current Meds  Medication Sig   albuterol (VENTOLIN HFA) 108 (90 Base) MCG/ACT inhaler Inhale into the lungs every 6 (six) hours as needed for wheezing or shortness of breath.   Calcium-Vitamin D (CALTRATE 600 PLUS-VIT D PO) Take by mouth.   carvedilol (COREG) 6.25 MG tablet    Cholecalciferol (VITAMIN D3 PO) Take by mouth. 5,000 mg once a day   cyanocobalamin (VITAMIN B12) 1000 MCG tablet Take 1,000 mcg by mouth daily.   desonide (DESOWEN) 0.05 % ointment Apply 1 Application topically 2 (two) times daily.   famotidine (PEPCID) 20 MG tablet One after supper   gabapentin (NEURONTIN) 100 MG capsule Take 1 capsule (100 mg total) by mouth 4 (four) times daily.   ibuprofen (ADVIL) 200 MG tablet Take 200 mg by mouth every 6 (six) hours as needed.   losartan (COZAAR) 100 MG tablet Take 100 mg by mouth daily.   pantoprazole (PROTONIX) 40 MG tablet Take 1 tablet (40 mg total) by mouth daily. Take 30-60 min before first meal of the day   Probiotic Product (PROBIOTIC DAILY PO) Take by mouth. Once a day   rosuvastatin (CRESTOR) 10 MG tablet Take 10 mg by mouth daily.   SYNTHROID 100 MCG tablet Take 100 mcg by mouth every morning.           Past Medical History:  Diagnosis Date   Asthma    Diabetes mellitus without complication (HCC)    Hyperlipidemia    Hypertension    Tachycardia    Thyroid disease       Objective:    Wts  07/11/2023         ***  06/12/2023      150 05/20/23 154 lb (69.9 kg)  01/18/17 155 lb (70.3 kg)  07/18/14 164 lb (74.4 kg)    Vital signs reviewed  07/11/2023  - Note at rest 02 sats  97% on RA   General appearance:    amb pleasant wf all smiles   HEENT : Oropharynx  clear      Nasal turbinates nl    NECK :  without  apparent JVD/ palpable  Nodes/TM    LUNGS: no acc muscle use,  Nl contour chest which is clear to A and P bilaterally without cough on insp or exp maneuvers   CV:  RRR  no s3 or murmur or increase in P2, and no edema   ABD:  soft and nontender with nl inspiratory excursion in the supine position. No  bruits or organomegaly appreciated   MS:  Nl gait/ ext warm without deformities Or obvious joint restrictions  calf tenderness, cyanosis or clubbing    SKIN: warm and dry without lesions    NEURO:  alert, approp, nl sensorium with  no motor or cerebellar deficits apparent. c            Assessment

## 2023-07-12 NOTE — Assessment & Plan Note (Signed)
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   These are the same pts (now being labeled as having "irritable larynx syndrome" by some cough centers) who not infrequently have a history of having failed to tolerate ace inhibitors,  dry powder inhalers or biphosphonates or report having atypical/extraesophageal reflux symptoms that don't respond to standard doses of PPI  and are easily confused as having aecopd or asthma flares by even experienced allergists/ pulmonologists (myself included).   Question is whether the gerd rx is really needed now that no longer coughing   Rec  Add zyrtec prn sneezing or obvious pnds   Ok to stop the  delsym first then after a week in no cough d/c  acid pills   (pantoprazole and famotidine)   Change  now gabapentin to 100 mg x 2   bfast and supper   =  400 mg per day  for several weeks if doing great  then try 100 mg twice daily until used all up rx   At any point if cough recurs go immediately back to prior step  F/u can be here or refer to ENT/ [facility] Dr Lynelle Doctor in the voice center.          Each maintenance medication was reviewed in detail  including emphasizing most importantly the difference between maintenance and prns and under what circumstances the prns are to be triggered using an action plan format where appropriate.  Total time for H and P, chart review, counseling  and generating customized AVS unique to this office visit / same day charting = 31 min summary final f/u ov

## 2023-07-30 ENCOUNTER — Other Ambulatory Visit: Payer: Self-pay | Admitting: Family Medicine

## 2023-07-30 DIAGNOSIS — Z1231 Encounter for screening mammogram for malignant neoplasm of breast: Secondary | ICD-10-CM

## 2023-09-04 ENCOUNTER — Ambulatory Visit
Admission: RE | Admit: 2023-09-04 | Discharge: 2023-09-04 | Disposition: A | Discharge status: Home / Self Care | Payer: Medicare Other | Source: Ambulatory Visit | Attending: Family Medicine | Admitting: Family Medicine

## 2023-09-04 DIAGNOSIS — Z1231 Encounter for screening mammogram for malignant neoplasm of breast: Secondary | ICD-10-CM

## 2023-09-20 ENCOUNTER — Other Ambulatory Visit: Payer: Self-pay | Admitting: Internal Medicine

## 2023-09-20 DIAGNOSIS — R058 Other specified cough: Secondary | ICD-10-CM

## 2024-03-22 ENCOUNTER — Ambulatory Visit (INDEPENDENT_AMBULATORY_CARE_PROVIDER_SITE_OTHER): Admitting: Neurology

## 2024-03-22 ENCOUNTER — Encounter: Payer: Self-pay | Admitting: Neurology

## 2024-03-22 VITALS — BP 134/82 | HR 67 | Resp 15 | Ht 63.0 in

## 2024-03-22 DIAGNOSIS — G250 Essential tremor: Secondary | ICD-10-CM | POA: Diagnosis not present

## 2024-03-22 NOTE — Progress Notes (Signed)
 Chief Complaint  Patient presents with   New Patient (Initial Visit)    Rm15, sister present,  referral for Tremor/Dr. Lonell Collet [facility] [phone]: pt stated that she has upper body tremors but sometime they are mainly isolated to hea, bilateral hands, pt stated that the left ring finger is in constant pain and the pain radiates up the arm to the bicep area. Pt stated that she gets dizziness frequently      ASSESSMENT AND PLAN  Kayla Becker is a 73 y.o. female   Head titubation, mild bilateral hands tremor  Most consistent with essential tremor  History of hypothyroidism, on thyroid supplement, was normal TSH,  There was no major limitation from her head tremor, decided not to take any medication for it  DIAGNOSTIC DATA (LABS, IMAGING, TESTING) - I reviewed patient records, labs, notes, testing and imaging myself where available.   MEDICAL HISTORY:  Kayla Becker, is a 73 year old female seen in request by her primary care from [facility] associates Dr. Collet, Lonell, for evaluation of head titubation, she is accompanied by her sister at today's visit on March 22, 2024    History is obtained from the patient and review of electronic medical records. I personally reviewed pertinent available imaging films in PACS.   PMHx of  Grave's disease, s/p radial active iodine treatment, on thyrodis supplement HTN HLD Pre-DM  She had gradual onset of head titubation for over 20 years, gradually getting worse over the past few years, also mild bilateral hands tremor, but there is no major limitation in her daily activity,  She workout regularly at the gym without much difficulty, there was no family history of tremor  Laboratory evaluation from primary care April 2025, LDL 66, normal CBC hemoglobin of 13.4, CMP creatinine of 0.78 A1c of 5.9, normal TSH 0.79     PHYSICAL EXAM:   Vitals:   03/22/24 1424  BP: 134/82  Pulse: 67  Resp: 15  SpO2: 97%  Height: 5' 3  (1.6 m)   Body mass index is 27.1 kg/m.  PHYSICAL EXAMNIATION:  Gen: NAD, conversant, well nourised, well groomed                     Cardiovascular: Regular rate rhythm, no peripheral edema, warm, nontender. Eyes: Conjunctivae clear without exudates or hemorrhage Neck: Supple, no carotid bruits. Pulmonary: Clear to auscultation bilaterally   NEUROLOGICAL EXAM:  MENTAL STATUS: Speech/cognition: Awake, alert, oriented to history taking and casual conversation, almost constant head titubation CRANIAL NERVES: CN II: Visual fields are full to confrontation. Pupils are round equal and briskly reactive to light. CN III, IV, VI: extraocular movement are normal. No ptosis. CN V: Facial sensation is intact to light touch CN VII: Face is symmetric with normal eye closure  CN VIII: Hearing is normal to causal conversation. CN IX, X: Phonation is normal. CN XI: Head turning and shoulder shrug are intact  MOTOR: Mild bilateral hand posturing tremor, slight difficulty drawing spiral circle, no rigidity, no bradykinesia, no weakness  REFLEXES: Reflexes are 2+ and symmetric at the biceps, triceps, knees, and ankles. Plantar responses are flexor.  SENSORY: Intact to light touch, pinprick and vibratory sensation are intact in fingers and toes.  COORDINATION: There is no trunk or limb dysmetria noted.  GAIT/STANCE: Posture is normal. Gait is steady   REVIEW OF SYSTEMS:  Full 14 system review of systems performed and notable only for as above All other review of systems were negative.  ALLERGIES: Allergies  Allergen Reactions   Bacitracin-Polymyxin B Dermatitis   Cephalexin Nausea And Vomiting   Ciprofloxacin    Latex     Other Reaction(s): blisters   Montelukast Cough   Naproxen     Other Reaction(s): severe chest pain   Neosporin [Neomycin-Bacitracin Zn-Polymyx]    Alogliptin Rash   Benzalkonium Chloride Rash   Morphine Nausea And Vomiting, Other (See Comments) and Rash     Other Reaction(s): severe nausea   Naproxen Sodium Palpitations   Polymyxin B Rash    HOME MEDICATIONS: Current Outpatient Medications  Medication Sig Dispense Refill   albuterol (VENTOLIN HFA) 108 (90 Base) MCG/ACT inhaler Inhale into the lungs every 6 (six) hours as needed for wheezing or shortness of breath.     Calcium-Vitamin D (CALTRATE 600 PLUS-VIT D PO) Take by mouth.     cyanocobalamin (VITAMIN B12) 1000 MCG tablet Take 1,000 mcg by mouth daily.     desonide (DESOWEN) 0.05 % ointment Apply 1 Application topically 2 (two) times daily.     ibuprofen (ADVIL) 200 MG tablet Take 200 mg by mouth every 6 (six) hours as needed.     losartan (COZAAR) 100 MG tablet Take 100 mg by mouth daily.     Probiotic Product (PROBIOTIC DAILY PO) Take by mouth. Once a day     rosuvastatin (CRESTOR) 10 MG tablet Take 10 mg by mouth daily.     SYNTHROID 100 MCG tablet Take 100 mcg by mouth every morning.     metoprolol succinate (TOPROL-XL) 25 MG 24 hr tablet Take 25 mg by mouth daily.     No current facility-administered medications for this visit.    PAST MEDICAL HISTORY: Past Medical History:  Diagnosis Date   Asthma    Diabetes mellitus without complication (HCC)    Hyperlipidemia    Hypertension    Tachycardia    Thyroid disease     PAST SURGICAL HISTORY: Past Surgical History:  Procedure Laterality Date   ABDOMINAL HYSTERECTOMY      FAMILY HISTORY: Family History  Problem Relation Age of Onset   Breast cancer Neg Hx     SOCIAL HISTORY: Social History   Socioeconomic History   Marital status: Married    Spouse name: Not on file   Number of children: Not on file   Years of education: Not on file   Highest education level: Not on file  Occupational History   Not on file  Tobacco Use   Smoking status: Never   Smokeless tobacco: Never  Vaping Use   Vaping status: Never Used  Substance and Sexual Activity   Alcohol use: Yes    Comment: socially   Drug use: No    Sexual activity: Not on file  Other Topics Concern   Not on file  Social History Narrative   Not on file   Social Drivers of Health   Financial Resource Strain: Not on file  Food Insecurity: Not on file  Transportation Needs: Not on file  Physical Activity: Not on file  Stress: Not on file  Social Connections: Unknown (01/15/2022)   Received [facility]   Social Network    Social Network: Not on file  Intimate Partner Violence: Unknown (12/06/2021)   Received from [facility]   HITS    Physically Hurt: Not on file    Insult or Talk Down To: Not on file    Threaten Physical Harm: Not on file    Scream or Curse: Not on  file      Modena Callander, M.D. Ph.D.  [facility] [address], [address] [place], [place] [address] Ph: [phone] Fax: [phone]  CC:  Gerome Brunet, DO [address] [place],  [place] [address]  Gerome Brunet, DO

## 2024-07-20 NOTE — Progress Notes (Unsigned)
 [facility] [address] [place] [address] Phone: [phone] Subjective:   LILLETTE Berwyn Posey, am serving as a scribe for Dr. Arthea Claudene.  I'm seeing this patient by the request  of:  Gerome Brunet, DO  CC: Low back pain  Kayla Becker  Kayla Becker is a 73 y.o. female coming in with complaint of LBP, most recent x-rays were taken in May 2023.  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  Patient has even undergone a facet injection in August 2023 at L4-L5. Patient states that she has pain in B glutes. No pattern to her intermittent pain. Uses Advil to fall asleep. Tried epidural injections which were not helpful. Pain can radiate up or down from the spine and into the legs.      Past Medical History:  Diagnosis Date   Asthma    Diabetes mellitus without complication (HCC)    Hyperlipidemia    Hypertension    Tachycardia    Thyroid disease    Past Surgical History:  Procedure Laterality Date   ABDOMINAL HYSTERECTOMY     Social History   Socioeconomic History   Marital status: Married    Spouse name: Not on file   Number of children: Not on file   Years of education: Not on file   Highest education level: Not on file  Occupational History   Not on file  Tobacco Use   Smoking status: Never   Smokeless tobacco: Never  Vaping Use   Vaping status: Never Used  Substance and Sexual Activity   Alcohol use: Yes    Comment: socially   Drug use: No   Sexual activity: Not on file  Other Topics Concern   Not on file  Social History Narrative   Not on file   Social Drivers of Health   Financial Resource Strain: Not on file  Food Insecurity: Not on file  Transportation Needs: Not on file  Physical Activity: Not on file  Stress: Not on file  Social Connections: Unknown (01/15/2022)   Received from [facility]   Social Network     Social Network: Not on file   Allergies  Allergen Reactions   Bacitracin-Polymyxin B Dermatitis   Cephalexin Nausea And Vomiting   Ciprofloxacin    Latex     Other Reaction(s): blisters   Montelukast Cough   Naproxen     Other Reaction(s): severe chest pain   Neosporin [Neomycin-Bacitracin Zn-Polymyx]    Alogliptin Rash   Benzalkonium Chloride Rash   Morphine Nausea And Vomiting, Other (See Comments) and Rash    Other Reaction(s): severe nausea   Naproxen Sodium Palpitations   Polymyxin B Rash   Family History  Problem Relation Age of Onset   Breast cancer Neg Hx     Current Outpatient Medications (Endocrine & Metabolic):    SYNTHROID 100 MCG tablet, Take 100 mcg by mouth every morning.  Current Outpatient Medications (Cardiovascular):    losartan (COZAAR) 100 MG tablet, Take 100 mg by mouth daily.   metoprolol succinate (TOPROL-XL) 25 MG 24 hr tablet, Take 25 mg by mouth daily.   rosuvastatin (CRESTOR) 10 MG tablet, Take 10 mg by mouth daily.  Current Outpatient Medications (Respiratory):    albuterol (VENTOLIN HFA) 108 (90 Base) MCG/ACT inhaler, Inhale into the lungs every 6 (six) hours as needed for wheezing or shortness of breath.  Current Outpatient Medications (Analgesics):  ibuprofen (ADVIL) 200 MG tablet, Take 200 mg by mouth every 6 (six) hours as needed.  Current Outpatient Medications (Hematological):    cyanocobalamin (VITAMIN B12) 1000 MCG tablet, Take 1,000 mcg by mouth daily.  Current Outpatient Medications (Other):    Calcium-Vitamin D (CALTRATE 600 PLUS-VIT D PO), Take by mouth.   DULoxetine (CYMBALTA) 20 MG capsule, Take 1 capsule (20 mg total) by mouth daily.   gabapentin  (NEURONTIN ) 100 MG capsule, Take 2 capsules (200 mg total) by mouth at bedtime.   Probiotic Product (PROBIOTIC DAILY PO), Take by mouth. Once a day   desonide (DESOWEN) 0.05 % ointment, Apply 1 Application topically 2 (two) times daily.   Reviewed prior external information  including notes and imaging from  primary care provider As well as notes that were available from care everywhere and other healthcare systems.  Past medical history, social, surgical and family history all reviewed in electronic medical record.  No pertanent information unless stated regarding to the chief complaint.   Review of Systems:  No headache, visual changes, nausea, vomiting, diarrhea, constipation, dizziness, abdominal pain, skin rash, fevers, chills, night sweats, weight loss, swollen lymph nodes, body aches, joint swelling, chest pain, shortness of breath, mood changes. POSITIVE muscle aches  Objective  Blood pressure 112/74, pulse 74, height 5' 3 (1.6 m), SpO2 95%.   General: No apparent distress alert and oriented x3 mood and affect normal, dressed appropriately.  HEENT: Pupils equal, extraocular movements intact  Respiratory: Patient's speak in full sentences and does not appear short of breath  Cardiovascular: No lower extremity edema, non tender, no erythema  Low back exam shows significant loss of lordosis.  Some atrophy noted of the gluteal aspect.  Worsening pain with extension of the back.  Patient only has 5 degrees of extension of the back.  Tightness with FABER right greater than left. Neurovascular intact distally.  [id number]; 15 additional minutes spent for Therapeutic exercises as stated in above notes.  This included exercises focusing on stretching, strengthening, with significant focus on eccentric aspects.   Long term goals include an improvement in range of motion, strength, endurance as well as avoiding reinjury. Patient's frequency would include in 1-2 times a day, 3-5 times a week for a duration of 6-12 weeks. Low back exercises that included:  Pelvic tilt/bracing instruction to focus on control of the pelvic girdle and lower abdominal muscles  Glute strengthening exercises, focusing on proper firing of the glutes without engaging the low back muscles Proper  stretching techniques for maximum relief for the hamstrings, hip flexors, low back and some rotation where tolerated  Proper technique shown and discussed handout in great detail with ATC.  All questions were discussed and answered.      Impression and Recommendations:      The above documentation has been reviewed and is accurate and complete Larri Brewton M Olie Scaffidi, DO

## 2024-07-21 ENCOUNTER — Encounter: Payer: Self-pay | Admitting: Family Medicine

## 2024-07-21 ENCOUNTER — Ambulatory Visit: Admitting: Family Medicine

## 2024-07-21 VITALS — BP 112/74 | HR 74 | Ht 63.0 in

## 2024-07-21 DIAGNOSIS — M4316 Spondylolisthesis, lumbar region: Secondary | ICD-10-CM | POA: Diagnosis not present

## 2024-07-21 MED ORDER — GABAPENTIN 100 MG PO CAPS: 200.0000 mg | ORAL_CAPSULE | Freq: Every day | ORAL | 0 refills | Status: AC

## 2024-07-21 MED ORDER — DULOXETINE HCL 20 MG PO CPEP: 20.0000 mg | ORAL_CAPSULE | Freq: Every day | ORAL | 0 refills | Status: DC

## 2024-07-21 NOTE — Patient Instructions (Addendum)
 Do prescribed exercises at least 3x a week Start Cymbalta 20mg  and Gabapentin  200mg  If on an antidepressant stop taking See you again in 2 months

## 2024-07-21 NOTE — Assessment & Plan Note (Addendum)
 Significant with potential grade 1.  We discussed with patient that patient is having some intermittent radicular symptoms.  Does have signs that are consistent with also spinal stenosis.  Hold on MRI at the moment but we did discuss the possibility of doing that to further evaluate for any nerve root impingement.  Patient will start on Cymbalta and stop other type of antianxiety medication.  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  Increase activity slowly.  Follow-up again in 6 to 8 weeks.  Worsening pain and I would encourage the MRI.  Patient knows if she starts Cymbalta and got worsening tremor to discontinue but I think it will be highly unlikely.  Gabapentin  200 mg given for nighttime relief as well.

## 2024-08-06 ENCOUNTER — Encounter: Payer: Self-pay | Admitting: Family Medicine

## 2024-08-06 MED ORDER — DULOXETINE HCL 30 MG PO CPEP: 30.0000 mg | ORAL_CAPSULE | Freq: Every day | ORAL | 2 refills | Status: DC

## 2024-08-30 ENCOUNTER — Other Ambulatory Visit: Payer: Self-pay | Admitting: Family Medicine

## 2024-09-21 NOTE — Progress Notes (Unsigned)
 " Kayla Becker [facility] [address] [place] [address] Phone: [phone] Subjective:   Kayla Becker am a scribe for Dr. Claudene.   I'm seeing this patient by the request  of:  Gerome Brunet, DO  CC: Back pain  YEP:Dlagzrupcz  07/21/2024 Significant with potential grade 1. We discussed with patient that patient is having some intermittent radicular symptoms. Does have signs that are consistent with also spinal stenosis. Hold on MRI at the moment but we did discuss the possibility of doing that to further evaluate for any nerve root impingement. Patient will start on Cymbalta  and stop other type of antianxiety medication. 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 Increase activity slowly. Follow-up again in 6 to 8 weeks. Worsening pain and I would encourage the MRI. Patient knows if she starts Cymbalta  and got worsening tremor to discontinue but I think it will be highly unlikely. Gabapentin  200 mg given for nighttime relief as well.   Updated 09/22/2024 Kayla Becker is a 74 y.o. female coming in with complaint of back pain.  Patient was a started on Cymbalta .  Being treated for the spondylolisthesis at L4-L5 as well and was doing home exercises.  Patient did send a note that was feeling better on the Cymbalta  and we increased to 30 mg.  Patient states the back pain in now leg pain on the right side. Physical therapy has helped a lot. It started soon after the last visit. Today the pain in the leg is livable. It is 3 out of 10 pain level.        Past Medical History:  Diagnosis Date   Asthma    Diabetes mellitus without complication (HCC)    Hyperlipidemia    Hypertension    Tachycardia    Thyroid disease    Past Surgical History:  Procedure Laterality Date   ABDOMINAL HYSTERECTOMY     Social History   Socioeconomic History   Marital status: Married    Spouse name: Not on file   Number of children: Not on file   Years of education:  Not on file   Highest education level: Not on file  Occupational History   Not on file  Tobacco Use   Smoking status: Never   Smokeless tobacco: Never  Vaping Use   Vaping status: Never Used  Substance and Sexual Activity   Alcohol use: Yes    Comment: socially   Drug use: No   Sexual activity: Not on file  Other Topics Concern   Not on file  Social History Narrative   Not on file   Social Drivers of Health   Tobacco Use: Low Risk (07/21/2024)   Patient History    Smoking Tobacco Use: Never    Smokeless Tobacco Use: Never    Passive Exposure: Not on file  Financial Resource Strain: Not on file  Food Insecurity: Not on file  Transportation Needs: Not on file  Physical Activity: Not on file  Stress: Not on file  Social Connections: Unknown (01/15/2022)   Received from [facility]   Social Network    Social Network: Not on file  Depression (PHQ2-9): Not on file  Alcohol Screen: Not on file  Housing: Not on file  Utilities: Not on file  Health Literacy: Not on file   Allergies[1] Family History  Problem Relation Age of Onset   Breast cancer Neg Hx     Current Outpatient Medications (Endocrine & Metabolic):    SYNTHROID  100 MCG tablet, Take 100 mcg by mouth every morning.  Current Outpatient Medications (Cardiovascular):    losartan (COZAAR) 100 MG tablet, Take 100 mg by mouth daily.   metoprolol succinate (TOPROL-XL) 25 MG 24 hr tablet, Take 25 mg by mouth daily.   rosuvastatin (CRESTOR) 10 MG tablet, Take 10 mg by mouth daily.  Current Outpatient Medications (Respiratory):    albuterol (VENTOLIN HFA) 108 (90 Base) MCG/ACT inhaler, Inhale into the lungs every 6 (six) hours as needed for wheezing or shortness of breath.  Current Outpatient Medications (Analgesics):    ibuprofen (ADVIL) 200 MG tablet, Take 200 mg by mouth every 6 (six) hours as needed.  Current Outpatient Medications (Hematological):    cyanocobalamin (VITAMIN B12) 1000 MCG tablet, Take 1,000  mcg by mouth daily.  Current Outpatient Medications (Other):    Calcium-Vitamin D (CALTRATE 600 PLUS-VIT D PO), Take by mouth.   desonide (DESOWEN) 0.05 % ointment, Apply 1 Application topically 2 (two) times daily.   DULoxetine  (CYMBALTA ) 30 MG capsule, TAKE 1 CAPSULE BY MOUTH EVERY DAY   gabapentin  (NEURONTIN ) 100 MG capsule, Take 2 capsules (200 mg total) by mouth at bedtime.   Probiotic Product (PROBIOTIC DAILY PO), Take by mouth. Once a day   Reviewed prior external information including notes and imaging from  primary care provider As well as notes that were available from care everywhere and other healthcare systems.  Past medical history, social, surgical and family history all reviewed in electronic medical record.  No pertanent information unless stated regarding to the chief complaint.   Review of Systems:  No headache, visual changes, nausea, vomiting, diarrhea, constipation, dizziness, abdominal pain, skin rash, fevers, chills, night sweats, weight loss, swollen lymph nodes, body aches, joint swelling, chest pain, shortness of breath, mood changes. POSITIVE muscle aches  Objective  There were no vitals taken for this visit.   General: No apparent distress alert and oriented x3 mood and affect normal, dressed appropriately.  HEENT: Pupils equal, extraocular movements intact  Respiratory: Patient's speak in full sentences and does not appear short of breath  Cardiovascular: No lower extremity edema, non tender, no erythema  Back exam shows patient does have some mild loss of lordosis but significantly more comfortable with sitting at this time.  Patient notes having some pain with resisted flexion of the right hip.  Significant more discomfort with internal rotation of the hip.  Negative fulcrum test noted today.  Neurovascularly intact distally.    Impression and Recommendations:    The above documentation has been reviewed and is accurate and complete Kayla CHRISTELLA Sharps,  DO        [1]  Allergies Allergen Reactions   Bacitracin-Polymyxin B Dermatitis   Cephalexin Nausea And Vomiting   Ciprofloxacin    Latex     Other Reaction(s): blisters   Montelukast Cough   Naproxen     Other Reaction(s): severe chest pain   Neosporin [Neomycin-Bacitracin Zn-Polymyx]    Alogliptin Rash   Benzalkonium Chloride Rash   Morphine Nausea And Vomiting, Other (See Comments) and Rash    Other Reaction(s): severe nausea   Naproxen Sodium Palpitations   Polymyxin B Rash   "

## 2024-09-22 ENCOUNTER — Ambulatory Visit: Admitting: Family Medicine

## 2024-09-22 ENCOUNTER — Ambulatory Visit

## 2024-09-22 VITALS — BP 110/60 | HR 79 | Ht 63.0 in

## 2024-09-22 DIAGNOSIS — M4316 Spondylolisthesis, lumbar region: Secondary | ICD-10-CM | POA: Diagnosis not present

## 2024-09-22 DIAGNOSIS — M79604 Pain in right leg: Secondary | ICD-10-CM

## 2024-09-22 NOTE — Patient Instructions (Addendum)
 Good to see you. Thigh compression sleeve daily for a week. Then with PT for two weeks. Tell physical therapy to work on Hip stability, hip adductor, eccentrics.   See me again in 2 months.

## 2024-09-22 NOTE — Assessment & Plan Note (Signed)
 Significant improvement at this time, but now having more of a right hip pain.  X-rays ordered today and does show moderate arthritic changes.  Patient though is having more pain that seems to be more secondary to quadricep.  Discussed with patient about icing regimen and home exercises, discussed which activities increase activity slowly.  Discussed icing regimen.  Follow-up with me 6 to 8 weeks

## 2024-09-27 ENCOUNTER — Ambulatory Visit: Payer: Self-pay | Admitting: Family Medicine

## 2024-10-05 ENCOUNTER — Other Ambulatory Visit: Payer: Self-pay | Admitting: Family Medicine

## 2024-10-05 DIAGNOSIS — Z1231 Encounter for screening mammogram for malignant neoplasm of breast: Secondary | ICD-10-CM

## 2024-10-07 ENCOUNTER — Inpatient Hospital Stay: Admission: RE | Admit: 2024-10-07 | Discharge: 2024-10-07 | Attending: Family Medicine | Admitting: Family Medicine

## 2024-10-07 DIAGNOSIS — Z1231 Encounter for screening mammogram for malignant neoplasm of breast: Secondary | ICD-10-CM

## 2024-11-24 ENCOUNTER — Ambulatory Visit: Admitting: Family Medicine
